# Patient Record
Sex: Male | Born: 1935 | Race: White | Hispanic: No | Marital: Married | Smoking: Former smoker
Health system: Southern US, Community
[De-identification: ages and names within clinical notes are randomized; demographics above are authoritative.]

## PROBLEM LIST (undated history)

## (undated) DIAGNOSIS — E079 Disorder of thyroid, unspecified: Secondary | ICD-10-CM

## (undated) DIAGNOSIS — E119 Type 2 diabetes mellitus without complications: Secondary | ICD-10-CM

## (undated) DIAGNOSIS — C61 Malignant neoplasm of prostate: Secondary | ICD-10-CM

## (undated) HISTORY — PX: CHOLECYSTECTOMY: SHX55

## (undated) HISTORY — DX: Disorder of thyroid, unspecified: E07.9

## (undated) HISTORY — DX: Malignant neoplasm of prostate: C61

## (undated) HISTORY — DX: Type 2 diabetes mellitus without complications: E11.9

## (undated) HISTORY — PX: HERNIA REPAIR: SHX51

---

## 1980-08-01 HISTORY — PX: NOSE SURGERY: SHX723

## 1982-08-01 HISTORY — PX: BACK SURGERY: SHX140

## 1985-08-01 HISTORY — PX: BACK SURGERY: SHX140

## 1995-08-02 HISTORY — PX: PROSTATE SURGERY: SHX751

## 2006-08-01 HISTORY — PX: BACK SURGERY: SHX140

## 2008-12-08 ENCOUNTER — Inpatient Hospital Stay (HOSPITAL_COMMUNITY): Admission: EM | Admit: 2008-12-08 | Discharge: 2008-12-12 | Payer: Self-pay | Admitting: Emergency Medicine

## 2008-12-10 ENCOUNTER — Ambulatory Visit: Payer: Self-pay | Admitting: Physical Medicine & Rehabilitation

## 2008-12-12 ENCOUNTER — Inpatient Hospital Stay (HOSPITAL_COMMUNITY)
Admission: RE | Admit: 2008-12-12 | Discharge: 2008-12-18 | Payer: Self-pay | Admitting: Physical Medicine & Rehabilitation

## 2010-11-09 LAB — GLUCOSE, CAPILLARY
Glucose-Capillary: 111 mg/dL — ABNORMAL HIGH (ref 70–99)
Glucose-Capillary: 116 mg/dL — ABNORMAL HIGH (ref 70–99)
Glucose-Capillary: 117 mg/dL — ABNORMAL HIGH (ref 70–99)
Glucose-Capillary: 118 mg/dL — ABNORMAL HIGH (ref 70–99)
Glucose-Capillary: 121 mg/dL — ABNORMAL HIGH (ref 70–99)
Glucose-Capillary: 123 mg/dL — ABNORMAL HIGH (ref 70–99)
Glucose-Capillary: 129 mg/dL — ABNORMAL HIGH (ref 70–99)
Glucose-Capillary: 115 mg/dL — ABNORMAL HIGH (ref 70–99)
Glucose-Capillary: 122 mg/dL — ABNORMAL HIGH (ref 70–99)
Glucose-Capillary: 128 mg/dL — ABNORMAL HIGH (ref 70–99)
Glucose-Capillary: 130 mg/dL — ABNORMAL HIGH (ref 70–99)
Glucose-Capillary: 138 mg/dL — ABNORMAL HIGH (ref 70–99)
Glucose-Capillary: 139 mg/dL — ABNORMAL HIGH (ref 70–99)
Glucose-Capillary: 140 mg/dL — ABNORMAL HIGH (ref 70–99)
Glucose-Capillary: 147 mg/dL — ABNORMAL HIGH (ref 70–99)
Glucose-Capillary: 199 mg/dL — ABNORMAL HIGH (ref 70–99)

## 2010-11-09 LAB — URINALYSIS, ROUTINE W REFLEX MICROSCOPIC
Bilirubin Urine: NEGATIVE
Ketones, ur: NEGATIVE mg/dL
Ketones, ur: 15 mg/dL — AB
Nitrite: NEGATIVE
Nitrite: NEGATIVE
Protein, ur: NEGATIVE mg/dL
Protein, ur: 100 mg/dL — AB
Specific Gravity, Urine: 1.033 — ABNORMAL HIGH (ref 1.005–1.030)
Urobilinogen, UA: 1 mg/dL (ref 0.0–1.0)
Urobilinogen, UA: 1 mg/dL (ref 0.0–1.0)

## 2010-11-09 LAB — DIFFERENTIAL
Basophils Absolute: 0 10*3/uL (ref 0.0–0.1)
Basophils Relative: 0 % (ref 0–1)
Eosinophils Absolute: 0.2 10*3/uL (ref 0.0–0.7)
Monocytes Absolute: 1.3 10*3/uL — ABNORMAL HIGH (ref 0.1–1.0)
Monocytes Relative: 13 % — ABNORMAL HIGH (ref 3–12)
Neutro Abs: 7.7 10*3/uL (ref 1.7–7.7)
Neutrophils Relative %: 75 % (ref 43–77)

## 2010-11-09 LAB — CBC
HCT: 33.4 % — ABNORMAL LOW (ref 39.0–52.0)
HCT: 22.7 % — ABNORMAL LOW (ref 39.0–52.0)
HCT: 25.5 % — ABNORMAL LOW (ref 39.0–52.0)
HCT: 32.6 % — ABNORMAL LOW (ref 39.0–52.0)
HCT: 34.4 % — ABNORMAL LOW (ref 39.0–52.0)
Hemoglobin: 11.5 g/dL — ABNORMAL LOW (ref 13.0–17.0)
Hemoglobin: 11.4 g/dL — ABNORMAL LOW (ref 13.0–17.0)
Hemoglobin: 12 g/dL — ABNORMAL LOW (ref 13.0–17.0)
Hemoglobin: 8 g/dL — ABNORMAL LOW (ref 13.0–17.0)
Hemoglobin: 8.6 g/dL — ABNORMAL LOW (ref 13.0–17.0)
Hemoglobin: 9 g/dL — ABNORMAL LOW (ref 13.0–17.0)
MCHC: 34.4 g/dL (ref 30.0–36.0)
MCHC: 35.1 g/dL (ref 30.0–36.0)
MCHC: 35.3 g/dL (ref 30.0–36.0)
MCHC: 35.8 g/dL (ref 30.0–36.0)
MCV: 90.7 fL (ref 78.0–100.0)
MCV: 91 fL (ref 78.0–100.0)
MCV: 91.4 fL (ref 78.0–100.0)
MCV: 91.8 fL (ref 78.0–100.0)
Platelets: 123 10*3/uL — ABNORMAL LOW (ref 150–400)
Platelets: 196 10*3/uL (ref 150–400)
Platelets: 85 10*3/uL — ABNORMAL LOW (ref 150–400)
Platelets: 91 10*3/uL — ABNORMAL LOW (ref 150–400)
RBC: 3.63 MIL/uL — ABNORMAL LOW (ref 4.22–5.81)
RBC: 3.8 MIL/uL — ABNORMAL LOW (ref 4.22–5.81)
RBC: 2.47 MIL/uL — ABNORMAL LOW (ref 4.22–5.81)
RBC: 2.65 MIL/uL — ABNORMAL LOW (ref 4.22–5.81)
RBC: 2.97 MIL/uL — ABNORMAL LOW (ref 4.22–5.81)
RDW: 14 % (ref 11.5–15.5)
RDW: 14.1 % (ref 11.5–15.5)
RDW: 12.9 % (ref 11.5–15.5)
RDW: 13 % (ref 11.5–15.5)
RDW: 13.1 % (ref 11.5–15.5)
RDW: 13.2 % (ref 11.5–15.5)
RDW: 13.9 % (ref 11.5–15.5)
WBC: 10.3 10*3/uL (ref 4.0–10.5)
WBC: 8.2 10*3/uL (ref 4.0–10.5)
WBC: 10.9 10*3/uL — ABNORMAL HIGH (ref 4.0–10.5)
WBC: 14.1 10*3/uL — ABNORMAL HIGH (ref 4.0–10.5)
WBC: 16.6 10*3/uL — ABNORMAL HIGH (ref 4.0–10.5)

## 2010-11-09 LAB — URINALYSIS, MICROSCOPIC ONLY
Glucose, UA: NEGATIVE mg/dL
Leukocytes, UA: NEGATIVE
Specific Gravity, Urine: 1.036 — ABNORMAL HIGH (ref 1.005–1.030)
Urobilinogen, UA: 1 mg/dL (ref 0.0–1.0)

## 2010-11-09 LAB — BASIC METABOLIC PANEL
CO2: 26 mEq/L (ref 19–32)
CO2: 28 mEq/L (ref 19–32)
Calcium: 8.3 mg/dL — ABNORMAL LOW (ref 8.4–10.5)
Calcium: 8.4 mg/dL (ref 8.4–10.5)
Chloride: 107 mEq/L (ref 96–112)
Chloride: 107 mEq/L (ref 96–112)
Creatinine, Ser: 0.89 mg/dL (ref 0.4–1.5)
Creatinine, Ser: 1.01 mg/dL (ref 0.4–1.5)
Creatinine, Ser: 1.33 mg/dL (ref 0.4–1.5)
GFR calc Af Amer: >60 mL/min (ref >60)
GFR calc Af Amer: >60 mL/min (ref >60)
GFR calc non Af Amer: 52 mL/min — ABNORMAL LOW (ref >60)
GFR calc non Af Amer: >60 mL/min (ref >60)
GFR calc non Af Amer: >60 mL/min (ref >60)
Glucose, Bld: 133 mg/dL — ABNORMAL HIGH (ref 70–99)
Glucose, Bld: 197 mg/dL — ABNORMAL HIGH (ref 70–99)
Sodium: 138 mEq/L (ref 135–145)

## 2010-11-09 LAB — PROTEIN S ACTIVITY: Protein S Activity: 51 % — ABNORMAL LOW (ref 69–129)

## 2010-11-09 LAB — COMPREHENSIVE METABOLIC PANEL
ALT: 59 U/L — ABNORMAL HIGH (ref 0–53)
Alkaline Phosphatase: 106 U/L (ref 39–117)
BUN: 17 mg/dL (ref 6–23)
Chloride: 100 mEq/L (ref 96–112)
Glucose, Bld: 129 mg/dL — ABNORMAL HIGH (ref 70–99)
Potassium: 4.3 mEq/L (ref 3.5–5.1)
Sodium: 136 mEq/L (ref 135–145)
Total Bilirubin: 2.2 mg/dL — ABNORMAL HIGH (ref 0.3–1.2)
Total Protein: 6.2 g/dL (ref 6.0–8.3)

## 2010-11-09 LAB — HEPATIC FUNCTION PANEL
Albumin: 2.9 g/dL — ABNORMAL LOW (ref 3.5–5.2)
Indirect Bilirubin: 1.6 mg/dL — ABNORMAL HIGH (ref 0.3–0.9)
Indirect Bilirubin: 1.6 mg/dL — ABNORMAL HIGH (ref 0.3–0.9)
Total Bilirubin: 2 mg/dL — ABNORMAL HIGH (ref 0.3–1.2)
Total Protein: 6.1 g/dL (ref 6.0–8.3)
Total Protein: 6.8 g/dL (ref 6.0–8.3)

## 2010-11-09 LAB — URINE CULTURE
Colony Count: 10000
Colony Count: NO GROWTH
Culture: NO GROWTH

## 2010-11-09 LAB — POCT I-STAT, CHEM 8
Calcium, Ion: 1.14 mmol/L (ref 1.12–1.32)
Glucose, Bld: 143 mg/dL — ABNORMAL HIGH (ref 70–99)
HCT: 48 % (ref 39.0–52.0)
Hemoglobin: 16.3 g/dL (ref 13.0–17.0)
Potassium: 3.1 mEq/L — ABNORMAL LOW (ref 3.5–5.1)
TCO2: 26 mmol/L (ref 0–100)

## 2010-11-09 LAB — BETA-2-GLYCOPROTEIN I ABS, IGG/M/A: Beta-2-Glycoprotein I IgA: 24 U/mL (ref <10)

## 2010-11-09 LAB — CARDIOLIPIN ANTIBODIES, IGG, IGM, IGA: Anticardiolipin IgG: <7 [GPL'U] — ABNORMAL LOW (ref <11)

## 2010-11-09 LAB — ABO/RH: ABO/RH(D): A POS

## 2010-11-09 LAB — FACTOR 5 LEIDEN

## 2010-11-09 LAB — CROSSMATCH: Antibody Screen: NEGATIVE

## 2010-11-09 LAB — PROTIME-INR
INR: 1 (ref 0.00–1.49)
Prothrombin Time: 13.9 seconds (ref 11.6–15.2)
Prothrombin Time: 14.6 seconds (ref 11.6–15.2)

## 2010-11-09 LAB — LUPUS ANTICOAGULANT PANEL: PTT Lupus Anticoagulant: 37.6 secs (ref 36.3–48.8)

## 2010-11-09 LAB — PROTEIN S, TOTAL: Protein S Ag, Total: 74 % (ref 70–140)

## 2010-11-09 LAB — PROTEIN C ACTIVITY: Protein C Activity: 131 % (ref 75–133)

## 2010-11-09 LAB — HOMOCYSTEINE: Homocysteine: 10.4 umol/L (ref 4.0–15.4)

## 2010-11-09 LAB — URINE MICROSCOPIC-ADD ON

## 2010-11-10 ENCOUNTER — Ambulatory Visit: Payer: Self-pay | Admitting: Internal Medicine
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
## 2012-02-01 ENCOUNTER — Ambulatory Visit: Payer: Self-pay | Admitting: Family Medicine

## 2014-10-17 ENCOUNTER — Ambulatory Visit: Payer: Self-pay | Admitting: Gastroenterology

## 2014-11-24 LAB — SURGICAL PATHOLOGY

## 2018-04-18 ENCOUNTER — Encounter: Payer: Self-pay | Admitting: Hematology and Oncology

## 2018-04-18 ENCOUNTER — Inpatient Hospital Stay: Payer: Medicare Other

## 2018-04-18 ENCOUNTER — Inpatient Hospital Stay: Payer: Medicare Other | Attending: Hematology and Oncology | Admitting: Hematology and Oncology

## 2018-04-18 VITALS — BP 136/69 | HR 57 | Temp 97.4°F | Resp 18 | Ht 66.0 in | Wt 152.8 lb

## 2018-04-18 DIAGNOSIS — Z87891 Personal history of nicotine dependence: Secondary | ICD-10-CM | POA: Insufficient documentation

## 2018-04-18 DIAGNOSIS — Z8546 Personal history of malignant neoplasm of prostate: Secondary | ICD-10-CM | POA: Insufficient documentation

## 2018-04-18 DIAGNOSIS — E119 Type 2 diabetes mellitus without complications: Secondary | ICD-10-CM

## 2018-04-18 DIAGNOSIS — Z79899 Other long term (current) drug therapy: Secondary | ICD-10-CM | POA: Diagnosis not present

## 2018-04-18 DIAGNOSIS — R161 Splenomegaly, not elsewhere classified: Secondary | ICD-10-CM | POA: Diagnosis not present

## 2018-04-18 DIAGNOSIS — M1711 Unilateral primary osteoarthritis, right knee: Secondary | ICD-10-CM | POA: Diagnosis not present

## 2018-04-18 DIAGNOSIS — G8929 Other chronic pain: Secondary | ICD-10-CM | POA: Diagnosis not present

## 2018-04-18 DIAGNOSIS — Z7982 Long term (current) use of aspirin: Secondary | ICD-10-CM | POA: Diagnosis not present

## 2018-04-18 DIAGNOSIS — E079 Disorder of thyroid, unspecified: Secondary | ICD-10-CM | POA: Diagnosis not present

## 2018-04-18 DIAGNOSIS — R5383 Other fatigue: Secondary | ICD-10-CM | POA: Diagnosis not present

## 2018-04-18 DIAGNOSIS — D72821 Monocytosis (symptomatic): Secondary | ICD-10-CM | POA: Diagnosis not present

## 2018-04-18 DIAGNOSIS — Z7984 Long term (current) use of oral hypoglycemic drugs: Secondary | ICD-10-CM | POA: Insufficient documentation

## 2018-04-18 DIAGNOSIS — D696 Thrombocytopenia, unspecified: Secondary | ICD-10-CM | POA: Diagnosis present

## 2018-04-18 DIAGNOSIS — Z882 Allergy status to sulfonamides status: Secondary | ICD-10-CM | POA: Diagnosis not present

## 2018-04-18 LAB — CBC WITH DIFFERENTIAL/PLATELET
BASOS PCT: 1 %
Basophils Absolute: 0.1 10*3/uL (ref 0–0.1)
EOS ABS: 0.3 10*3/uL (ref 0–0.7)
Eosinophils Relative: 3 %
HCT: 42.8 % (ref 40.0–52.0)
HEMOGLOBIN: 14.5 g/dL (ref 13.0–18.0)
LYMPHS ABS: 2.7 10*3/uL (ref 1.0–3.6)
Lymphocytes Relative: 31 %
MCH: 30.9 pg (ref 26.0–34.0)
MCHC: 33.8 g/dL (ref 32.0–36.0)
MCV: 91.5 fL (ref 80.0–100.0)
Monocytes Absolute: 1.5 10*3/uL — ABNORMAL HIGH (ref 0.2–1.0)
Monocytes Relative: 17 %
NEUTROS PCT: 48 %
Neutro Abs: 4.2 10*3/uL (ref 1.4–6.5)
Platelets: 143 10*3/uL — ABNORMAL LOW (ref 150–440)
RBC: 4.68 MIL/uL (ref 4.40–5.90)
RDW: 14.2 % (ref 11.5–14.5)
WBC: 8.8 10*3/uL (ref 3.8–10.6)

## 2018-04-18 LAB — FOLATE: Folate: 70.3 ng/mL (ref >5.9)

## 2018-04-18 LAB — TECHNOLOGIST SMEAR REVIEW

## 2018-04-18 LAB — PROTIME-INR
INR: 0.98
PROTHROMBIN TIME: 12.8 s (ref 11.4–15.2)

## 2018-04-18 LAB — PLATELET BY CITRATE: PLATELET CT IN CITRATE: 140

## 2018-04-18 LAB — APTT: APTT: 32 s (ref 24–36)

## 2018-04-18 LAB — VITAMIN B12: Vitamin B-12: 558 pg/mL (ref 180–914)

## 2018-04-18 NOTE — Progress Notes (Signed)
[facility] day:  04/18/2018  Chief Complaint: Zachary Wells is a 84 y.o. male with thrombocytopenia who is referred in consultation by Dr Hortencia Pilar for assessment and management.  HPI: Patient diagnosed with thrombocytopenia in 03/2018.  Repeat labs in 2 weeks revealed an even lower platelets count. On 3rd lab draw, platelets continued to decline and he was therefore referred for hematology evaluation and management.   Review of labs dating back to 09/08/2014 reveals intermittent thrombocytopenia.  Platelet count has ranged between 102,000 - 245,000.  He has had persistent mild thrombocytopenia since 03/2018.  Platelet count was 146,000 on 03/13/2018, 139,000 on 03/27/2018, and 129,000 on 04/10/2018.  CBC on 04/10/2018 revealed a hematocrit of 40.8, hemoglobin 13.9, MCV 89, platelets 129,000, WBC 7800 with an ANC of 4100.  Differential included 52% neutrophils, 30% lymphs, 13% monocytes, and 5% eosinophils.  Absolute monocyte count was 1000.  Absolute monocyte count has ranged between 1000 - 1800 since 09/2014 except twice (800-900) between 03/13/2018 - 03/27/2018.  Monocytes have ranged from 10-27%.  Additional labs on 03/13/2018 included a creatinine of 1.5, normal LFTs, TSH 2.57.  Patient denies new medications. Patient takes glucosamine, TMV, coconut oil capsules, and fish oil daily.   Symptomatically, patient is fatigued. He spends the majority of his day sleeping. He maintains the ability to independently perform all of his ADLs. Patient "strangles" easily. Patient denies that he has experienced any B symptoms. He denies any recent infections. Patient has received blood transfusions in the past.   Patient complains of chronic pain in his RIGHT knee related to osteoarthritis. PMH significant for recurrent skin cancers.  Patient has a history of prostate cancer. Additionally, he had 2 brothers with prostate cancer.   Past Medical History:   Diagnosis Date  . Diabetes mellitus without complication ([place])   . Prostate cancer ([place])   . Thyroid disease     Past Surgical History:  Procedure Laterality Date  . BACK SURGERY  2008  . BACK SURGERY  1987  . BACK SURGERY  1984  . CHOLECYSTECTOMY    . HERNIA REPAIR  2004 and 2002  . NOSE SURGERY  1982  . PROSTATE SURGERY  1997    Family History  Problem Relation Age of Onset  . Cancer Brother     Social History:  reports that he has quit smoking. He has a 20.00 pack-year smoking history. He does not have any smokeless tobacco history on file. He reports that he drank alcohol. He reports that he does not use drugs.  Former smoker 40 years ago. No alcohol intake. Past exposures of PCB (transformer oil). The patient is accompanied by his wife and son today.  Allergies:  Allergies  Allergen Reactions  . Sulfa Antibiotics Rash  . Simvastatin Other (See Comments)    Current Medications: Current Outpatient Medications  Medication Sig Dispense Refill  . aspirin (ASPIR-LOW) 81 MG EC tablet Take 81 mg by mouth daily.    . Blood Glucose Monitoring Suppl (FIFTY50 GLUCOSE METER 2.0) w/Device KIT Please check sugars once daily E11.9 Freestyle    . cetirizine (ZYRTEC) 10 MG tablet Take 10 mg by mouth daily.    . clopidogrel (PLAVIX) 75 MG tablet Take 75 mg by mouth daily.    Marland Kitchen desoximetasone (TOPICORT) 0.25 % cream desoximetasone 0.25 % topical cream    . erythromycin ophthalmic ointment Place 1 application into both eyes daily.    . fluocinonide cream (LIDEX) 0.05 % fluocinonide  0.05 % topical cream    . fluorouracil (EFUDEX) 5 % cream fluorouracil 5 % topical cream    . fluticasone (FLONASE) 50 MCG/ACT nasal spray Place 1 spray into both nostrils daily.    . Glucosamine Sulfate 1000 MG CAPS Take 2,000 mg by mouth daily.    Marland Kitchen glucose blood test strip FreeStyle Lite Strips    . Lancets 28G MISC Lancets,Ultra Thin 26 gauge    . levothyroxine (SYNTHROID, LEVOTHROID) 75 MCG tablet Take  75 mcg by mouth daily.    . metFORMIN (GLUCOPHAGE) 500 MG tablet Take 500 mg by mouth daily.    . Multiple Vitamins-Minerals (CENTRUM SILVER PO) Take 1 tablet by mouth daily.    . Omega-3 Fatty Acids (OMEGA-3 FISH OIL PO) Take 400 mg by mouth daily.    . rosuvastatin (CRESTOR) 40 MG tablet Take 40 mg by mouth daily.    . sildenafil (VIAGRA) 50 MG tablet Take 50 mg by mouth as needed.    . simvastatin (ZOCOR) 20 MG tablet Take 20 mg by mouth daily.     No current facility-administered medications for this visit.     Review of Systems:  GENERAL:  Feels "fine", but tired.  Sleeps a lot.  No fevers, sweats or weight loss. PERFORMANCE STATUS (ECOG): 1 HEENT:  HOH; bilateral hearing aides. No visual changes, runny nose, sore throat, mouth sores or tenderness. Lungs: No shortness of breath or cough.  No hemoptysis. Cardiac:  No chest pain, palpitations, orthopnea, or PND. GI:  "Strangles easily".  No nausea, vomiting, diarrhea, constipation, melena or hematochezia. GU:  No urgency, frequency, dysuria, or hematuria. Musculoskeletal:  No back pain.  RIGHT knee pain; OA.  No muscle tenderness. Extremities:  No pain or swelling. Skin:  No rashes or skin changes. Neuro:  No headache, numbness or weakness, balance or coordination issues. Endocrine:  Diabetes.  No thyroid issues, hot flashes or night sweats. Psych:  No mood changes, depression or anxiety. Pain:  No focal pain. Review of systems:  All other systems reviewed and found to be negative.  Physical Exam: Blood pressure 136/69, pulse (!) 57, temperature (!) 97.4 F (36.3 C), temperature source Tympanic, resp. rate 18, height 5' 6"  (1.676 m), weight 69 lb 6 oz (31.5 kg). GENERAL:  Well developed, well nourished, gentleman sitting comfortably in the exam room in no acute distress. MENTAL STATUS:  Alert and oriented to person, place and time. HEAD:  White hair.  Normocephalic, atraumatic, face symmetric, no Cushingoid features. EYES:  Blue  eyes.  Pupils equal round and reactive to light and accomodation.  No conjunctivitis or scleral icterus. ENT:  Hearing aide.  Oropharynx clear without lesion.  Tongue normal. Mucous membranes moist.  RESPIRATORY:  Clear to auscultation without rales, wheezes or rhonchi. CARDIOVASCULAR:  Regular rate and rhythm without murmur, rub or gallop. ABDOMEN:  Soft, non-tender, with active bowel sounds, and no hepatomegaly.  Spleen tip palpable.  No masses. SKIN:  No rashes, ulcers or lesions. EXTREMITIES: No edema, no skin discoloration or tenderness.  No palpable cords. LYMPH NODES: No palpable cervical, supraclavicular, axillary or inguinal adenopathy  NEUROLOGICAL: Unremarkable. PSYCH:  Appropriate.   Office Visit on 04/18/2018  Component Date Value Ref Range Status  . Tech Review 04/18/2018 MORPHOLOGY UNREMARKABLE   Final   Performed at [facility], [address] [place], [place] [address]  . aPTT 04/18/2018 32  24 - 36 seconds Final   Performed at [facility], [place],  [place] [address]  . Prothrombin Time 04/18/2018 12.8  11.4 - 15.2 seconds Final  . INR 04/18/2018 0.98   Final   Performed at [facility], [address] [place], [place] [address]  . HIV Screen 4th Generation wRfx 04/18/2018 Non Reactive  Non Reactive Final   Comment: (NOTE) Performed At: [facility] [place], [place] [id number] Rush Farmer MD [id number]   . Hep B Core Total Ab 04/18/2018 Negative  Negative Final   Comment: (NOTE) Performed At: [facility] [place], [place] [id number] Rush Farmer MD [id number]   . HCV Ab 04/18/2018 <0.1  0.0 - 0.9 s/co ratio Final   Comment: (NOTE)                                  Negative:     < 0.8                             Indeterminate: 0.8 - 0.9                                  Positive:     > 0.9 The CDC recommends that a positive HCV antibody result be  followed up with a HCV Nucleic Acid Amplification test (481856). Performed At: [facility] [place], [place] [id number] Rush Farmer MD [id number]   . Anti Nuclear Antibody(ANA) 04/18/2018 Negative  Negative Final   Comment: (NOTE) Performed At: [facility] [place], [place] [id number] Rush Farmer MD [id number]   . Copper 04/18/2018 90  72 - 166 ug/dL Final   Comment: (NOTE) This test was developed and its performance characteristics determined by [facility]. It has not been cleared or approved by the Food and Drug Administration.                                Detection Limit = 5 Performed At: [facility] [place], [place] [id number] Rush Farmer MD [id number]   . Folate 04/18/2018 70.3  >5.9 ng/mL Final   Comment: RESULT CONFIRMED BY MANUAL DILUTION AKT Performed at [facility], [address] [place], [place] [address]   . Vitamin B-12 04/18/2018 558  180 - 914 pg/mL Final   Comment: (NOTE) This assay is not validated for testing neonatal or myeloproliferative syndrome specimens for Vitamin B12 levels. Performed at [facility], [place] [address] [place], [place] [address]   . Platelet CT in Citrtae 04/18/2018 140   Final   Comment: PLATELET COUNT PERFORMED ON CITRATED BLOOD Performed at [facility], [address], [place], [place] [address]   . WBC 04/18/2018 8.8  3.8 - 10.6 K/uL Final  . RBC 04/18/2018 4.68  4.40 - 5.90 MIL/uL Final  . Hemoglobin 04/18/2018 14.5  13.0 - 18.0 g/dL Final  . HCT 04/18/2018 42.8  40.0 - 52.0 % Final  . MCV 04/18/2018 91.5  80.0 - 100.0 fL Final  . MCH 04/18/2018 30.9  26.0 - 34.0 pg Final  . MCHC 04/18/2018 33.8  32.0 - 36.0 g/dL Final  . RDW 04/18/2018 14.2  11.5 - 14.5 % Final  . Platelets 04/18/2018 143* 150 - 440 K/uL Final  .  Neutrophils Relative % 04/18/2018 48  % Final  . Neutro Abs 04/18/2018 4.2  1.4 - 6.5 K/uL  Final  . Lymphocytes Relative 04/18/2018 31  % Final  . Lymphs Abs 04/18/2018 2.7  1.0 - 3.6 K/uL Final  . Monocytes Relative 04/18/2018 17  % Final  . Monocytes Absolute 04/18/2018 1.5* 0.2 - 1.0 K/uL Final  . Eosinophils Relative 04/18/2018 3  % Final  . Eosinophils Absolute 04/18/2018 0.3  0 - 0.7 K/uL Final  . Basophils Relative 04/18/2018 1  % Final  . Basophils Absolute 04/18/2018 0.1  0 - 0.1 K/uL Final   Performed at [facility], [address], [place], [place] [address]    Assessment:  JARRIEL PAPILLION is a 84 y.o. male with mild thrombocytopenia.  He has had intermittent thrombocytopenia dating back to 09/2014.  Platelet count has ranged between 102,000 - 245,000.  He has had persistent mild thrombocytopenia since 03/2018.  Platelet count was 146,000 on 03/13/2018 and 129,000 on 04/10/2018.  He has had persistent monocytosis since 2016.  He denies any new medications or herbal products.  He has received blood transfusions in the past.  He has no history of autoimmune disease or hepatitis.  Symptomatically, he is fatigued.  He denies any B symptoms.  Exam reveals palpable spleen tip.  Plan: 1.  Labs today:  CBC with diff, platelet count in blue top tube, B12, folate, copper, ANA with reflex, hepatitis B core antibody, hepatitis C antibody, HIV testing, PT, PTT, BCR-ABL. 2.  Peripheral smear for path review. 3.  Discuss differential diagnosis:  pseudothrombocytopenia, nutritional deficiencies, medications and herbal products, viruses, liver disease, myelodysplastic syndromes including CMML.  Patient noted to have persistent monocytosis since 2016. 3.  Schedule ultrasound. 4.  RTC in 1 weeks (after [place]) for MD assessment and to review workup   Honor Loh, NP  04/18/2018, 11:57 AM   I saw and evaluated the patient, participating in the key portions of the service and reviewing pertinent diagnostic studies and records.  I reviewed the nurse practitioner's note and agree  with the findings and the plan.  The assessment and plan were discussed with the patient.  Multiple questions were asked by the patient and answered.   Nolon Stalls, MD 04/18/2018, 11:57 AM

## 2018-04-18 NOTE — Progress Notes (Signed)
Patient here today as new evaluation regarding thrombocytopenia.  Referred by Dr. Hoy Morn.

## 2018-04-19 LAB — HEPATITIS B CORE ANTIBODY, TOTAL: Hep B Core Total Ab: NEGATIVE

## 2018-04-19 LAB — HIV ANTIBODY (ROUTINE TESTING W REFLEX): HIV Screen 4th Generation wRfx: NONREACTIVE

## 2018-04-19 LAB — ANA W/REFLEX: Anti Nuclear Antibody(ANA): NEGATIVE

## 2018-04-19 LAB — HEPATITIS C ANTIBODY

## 2018-04-20 LAB — COPPER, SERUM: Copper: 90 ug/dL (ref 72–166)

## 2018-04-22 ENCOUNTER — Encounter: Payer: Self-pay | Admitting: Hematology and Oncology

## 2018-04-24 ENCOUNTER — Ambulatory Visit
Admission: RE | Admit: 2018-04-24 | Discharge: 2018-04-24 | Disposition: A | Discharge status: Home / Self Care | Payer: Medicare Other | Source: Ambulatory Visit | Attending: Urgent Care | Admitting: Urgent Care

## 2018-04-24 DIAGNOSIS — R161 Splenomegaly, not elsewhere classified: Secondary | ICD-10-CM | POA: Insufficient documentation

## 2018-04-24 DIAGNOSIS — D696 Thrombocytopenia, unspecified: Secondary | ICD-10-CM

## 2018-04-25 ENCOUNTER — Encounter: Payer: Self-pay | Admitting: Hematology and Oncology

## 2018-04-25 ENCOUNTER — Inpatient Hospital Stay (HOSPITAL_BASED_OUTPATIENT_CLINIC_OR_DEPARTMENT_OTHER): Payer: Medicare Other | Admitting: Hematology and Oncology

## 2018-04-25 VITALS — BP 141/62 | HR 59 | Temp 94.8°F | Wt 155.0 lb

## 2018-04-25 DIAGNOSIS — E119 Type 2 diabetes mellitus without complications: Secondary | ICD-10-CM

## 2018-04-25 DIAGNOSIS — D696 Thrombocytopenia, unspecified: Secondary | ICD-10-CM | POA: Diagnosis not present

## 2018-04-25 DIAGNOSIS — Z79899 Other long term (current) drug therapy: Secondary | ICD-10-CM

## 2018-04-25 DIAGNOSIS — D72821 Monocytosis (symptomatic): Secondary | ICD-10-CM

## 2018-04-25 DIAGNOSIS — Z87891 Personal history of nicotine dependence: Secondary | ICD-10-CM

## 2018-04-25 DIAGNOSIS — G8929 Other chronic pain: Secondary | ICD-10-CM

## 2018-04-25 DIAGNOSIS — Z8546 Personal history of malignant neoplasm of prostate: Secondary | ICD-10-CM

## 2018-04-25 DIAGNOSIS — Z882 Allergy status to sulfonamides status: Secondary | ICD-10-CM

## 2018-04-25 DIAGNOSIS — Z7984 Long term (current) use of oral hypoglycemic drugs: Secondary | ICD-10-CM

## 2018-04-25 DIAGNOSIS — Z7982 Long term (current) use of aspirin: Secondary | ICD-10-CM

## 2018-04-25 DIAGNOSIS — R161 Splenomegaly, not elsewhere classified: Secondary | ICD-10-CM | POA: Diagnosis not present

## 2018-04-25 DIAGNOSIS — R5383 Other fatigue: Secondary | ICD-10-CM

## 2018-04-25 DIAGNOSIS — E079 Disorder of thyroid, unspecified: Secondary | ICD-10-CM

## 2018-04-25 DIAGNOSIS — M1711 Unilateral primary osteoarthritis, right knee: Secondary | ICD-10-CM

## 2018-04-25 NOTE — Progress Notes (Signed)
[facility] day: 04/25/2018   Chief Complaint: Zachary Wells is a 84 y.o. male with thrombocytopenia who is seen for review of work-up and discussion regarding direction of therapy.  HPI:  The patient was last seen in the hematology clinic on 04/18/2018 for initial consultation. He had persistent mild thrombocytopenia since 03/2018.  He had persistent monocytosis since 2016.  Symptomatically, he felt fatigued.  Workup included the following normal studies:  PT, PTT, hepatitis B core antibody, hepatitis C antibody, HIV antibody, ANA with reflex, B12, folate, and copper level.   Platelet count in a blue top tube (citrated) was 140,000.  Peripheral smear was unremarkable.  BCR-ABL is pending.  Abdominal ultrasound on 04/24/2018 revealed no splenomegaly.  Liver and spleen were unremarkable.  During the interim, patient has been doing well overall. He notes that he remains fatigued, however he has felt well over the last few days. Patient denies acute complaints. Patient denies that he has experienced any B symptoms. He denies any interval infections.   Patient advises that he maintains an adequate appetite. He is eating well.Weight today is 154 lb 15.7 oz (70.3 kg), which compared to his last visit to the clinic, represents a 2 pound.     Patient denies pain in the clinic today.   Past Medical History:  Diagnosis Date  . Diabetes mellitus without complication ([place])   . Prostate cancer ([place])   . Thyroid disease     Past Surgical History:  Procedure Laterality Date  . BACK SURGERY  2008  . BACK SURGERY  1987  . BACK SURGERY  1984  . CHOLECYSTECTOMY    . HERNIA REPAIR  2004 and 2002  . NOSE SURGERY  1982  . PROSTATE SURGERY  1997    Family History  Problem Relation Age of Onset  . Cancer Brother     Social History:  reports that he has quit smoking. He has a 20.00 pack-year smoking history. He does not have any smokeless tobacco  history on file. He reports that he drank alcohol. He reports that he does not use drugs.  Former smoker 40 years ago. No alcohol intake. Past exposures of PCB (transformer oil). The patient is accompanied by his wife today.  Allergies:  Allergies  Allergen Reactions  . Sulfa Antibiotics Rash  . Simvastatin Other (See Comments)    Current Medications: Current Outpatient Medications  Medication Sig Dispense Refill  . aspirin (ASPIR-LOW) 81 MG EC tablet Take 81 mg by mouth daily.    . Blood Glucose Monitoring Suppl (FIFTY50 GLUCOSE METER 2.0) w/Device KIT Please check sugars once daily E11.9 Freestyle    . cetirizine (ZYRTEC) 10 MG tablet Take 10 mg by mouth daily.    . clopidogrel (PLAVIX) 75 MG tablet Take 75 mg by mouth daily.    Marland Kitchen desoximetasone (TOPICORT) 0.25 % cream desoximetasone 0.25 % topical cream    . erythromycin ophthalmic ointment Place 1 application into both eyes daily.    . fluocinonide cream (LIDEX) 0.05 % fluocinonide 0.05 % topical cream    . fluorouracil (EFUDEX) 5 % cream fluorouracil 5 % topical cream    . fluticasone (FLONASE) 50 MCG/ACT nasal spray Place 1 spray into both nostrils daily.    . Glucosamine Sulfate 1000 MG CAPS Take 2,000 mg by mouth daily.    Marland Kitchen glucose blood test strip FreeStyle Lite Strips    . Lancets 28G MISC Lancets,Ultra Thin 26 gauge    . levothyroxine (  SYNTHROID, LEVOTHROID) 75 MCG tablet Take 75 mcg by mouth daily.    . metFORMIN (GLUCOPHAGE) 500 MG tablet Take 500 mg by mouth daily.    . Multiple Vitamins-Minerals (CENTRUM SILVER PO) Take 1 tablet by mouth daily.    . Omega-3 Fatty Acids (OMEGA-3 FISH OIL PO) Take 400 mg by mouth daily.    . rosuvastatin (CRESTOR) 40 MG tablet Take 40 mg by mouth daily.    . sildenafil (VIAGRA) 50 MG tablet Take 50 mg by mouth as needed.    . simvastatin (ZOCOR) 20 MG tablet Take 20 mg by mouth daily.     No current facility-administered medications for this visit.     Review of Systems   Constitutional: Positive for malaise/fatigue. Negative for diaphoresis, fever and weight loss (up 2 pounds; "I am eating ice cream").  HENT: Positive for hearing loss (hearing aides).   Eyes: Negative.   Respiratory: Negative for cough, hemoptysis, sputum production and shortness of breath.   Cardiovascular: Negative for chest pain, palpitations, orthopnea, leg swelling and PND.  Gastrointestinal: Negative for abdominal pain, blood in stool, constipation, diarrhea, melena, nausea and vomiting.       Strangles easily  Genitourinary: Negative for dysuria, frequency, hematuria and urgency.  Musculoskeletal: Positive for joint pain (RIGHT knee pain; OA). Negative for back pain, falls and myalgias.  Skin: Negative for itching and rash.  Neurological: Negative for dizziness, tremors, weakness and headaches.  Endo/Heme/Allergies: Does not bruise/bleed easily.       Diabetes  Psychiatric/Behavioral: Negative for depression, memory loss and suicidal ideas. The patient is not nervous/anxious and does not have insomnia.   All other systems reviewed and are negative.  Performance status (ECOG): 1 - Symptomatic but completely ambulatory  Vital Signs: BP (!) 141/62 (BP Location: Right Arm, Patient Position: Sitting)   Pulse (!) 59   Temp (!) 94.8 F (34.9 C) (Tympanic)   Wt 154 lb 15.7 oz (70.3 kg)   BMI 25.01 kg/m   Physical Exam  Constitutional: He is oriented to person, place, and time and well-developed, well-nourished, and in no distress.  HENT:  Head: Normocephalic and atraumatic.  Mouth/Throat: Mucous membranes are normal.  White hair  Eyes: Conjunctivae and EOM are normal. No scleral icterus.  Blue eyes  Neurological: He is alert and oriented to person, place, and time.  Psychiatric: Mood, affect and judgment normal.  Nursing note and vitals reviewed.    No visits with results within 3 Day(s) from this visit.  Latest known visit with results is:  Office Visit on 04/18/2018   Component Date Value Ref Range Status  . Tech Review 04/18/2018 MORPHOLOGY UNREMARKABLE   Final   Performed at [facility], [address] [place], [place] [address]  . aPTT 04/18/2018 32  24 - 36 seconds Final   Performed at [facility], [address] [place], [place] [address]  . Prothrombin Time 04/18/2018 12.8  11.4 - 15.2 seconds Final  . INR 04/18/2018 0.98   Final   Performed at [facility], [address], [place], [place] [address]  . HIV Screen 4th Generation wRfx 04/18/2018 Non Reactive  Non Reactive Final   Comment: (NOTE) Performed At: [facility] [place], [place] [id number] Rush Farmer MD [id number]   . Hep B Core Total Ab 04/18/2018 Negative  Negative Final   Comment: (NOTE) Performed At: [facility] [place], [place] [id number] Rush Farmer MD [id number]   .  HCV Ab 04/18/2018 <0.1  0.0 - 0.9 s/co ratio Final   Comment: (NOTE)                                  Negative:     < 0.8                             Indeterminate: 0.8 - 0.9                                  Positive:     > 0.9 The CDC recommends that a positive HCV antibody result be followed up with a HCV Nucleic Acid Amplification test (568127). Performed At: [facility] [place], [place] [id number] Rush Farmer MD [id number]   . Anti Nuclear Antibody(ANA) 04/18/2018 Negative  Negative Final   Comment: (NOTE) Performed At: [facility] [place], [place] [id number] Rush Farmer MD [id number]   . Copper 04/18/2018 90  72 - 166 ug/dL Final   Comment: (NOTE) This test was developed and its performance characteristics determined by [facility]. It has not been cleared or approved by the Food and Drug Administration.                                Detection Limit = 5 Performed At: [facility] [place], [place] [id number] Rush Farmer MD [id number]   . Folate 04/18/2018 70.3  >5.9 ng/mL Final   Comment: RESULT CONFIRMED BY MANUAL DILUTION AKT Performed at [facility], [address] [place], [place] [address]   . Vitamin B-12 04/18/2018 558  180 - 914 pg/mL Final   Comment: (NOTE) This assay is not validated for testing neonatal or myeloproliferative syndrome specimens for Vitamin B12 levels. Performed at [facility], [place] [address] [place], [place] [address]   . Platelet CT in Citrtae 04/18/2018 140   Final   Comment: PLATELET COUNT PERFORMED ON CITRATED BLOOD Performed at [facility], [address] [place], [place] [address]   . WBC 04/18/2018 8.8  3.8 - 10.6 K/uL Final  . RBC 04/18/2018 4.68  4.40 - 5.90 MIL/uL Final  . Hemoglobin 04/18/2018 14.5  13.0 - 18.0 g/dL Final  . HCT 04/18/2018 42.8  40.0 - 52.0 % Final  . MCV 04/18/2018 91.5  80.0 - 100.0 fL Final  . MCH 04/18/2018 30.9  26.0 - 34.0 pg Final  . MCHC 04/18/2018 33.8  32.0 - 36.0 g/dL Final  . RDW 04/18/2018 14.2  11.5 - 14.5 % Final  . Platelets 04/18/2018 143* 150 - 440 K/uL Final  . Neutrophils Relative % 04/18/2018 48  % Final  . Neutro Abs 04/18/2018 4.2  1.4 - 6.5 K/uL Final  . Lymphocytes Relative 04/18/2018 31  % Final  . Lymphs Abs 04/18/2018 2.7  1.0 - 3.6 K/uL Final  . Monocytes Relative 04/18/2018 17  % Final  . Monocytes Absolute 04/18/2018 1.5* 0.2 - 1.0 K/uL Final  . Eosinophils Relative 04/18/2018 3  % Final  . Eosinophils Absolute 04/18/2018 0.3  0 - 0.7 K/uL Final  . Basophils Relative 04/18/2018 1  % Final  . Basophils Absolute 04/18/2018 0.1  0 - 0.1 K/uL Final  Performed at [facility], [address] [place], [place] [address]    Assessment:  Zachary Wells is a 84 y.o. male with mild thrombocytopenia.  He has had intermittent thrombocytopenia dating back to 09/2014.  Platelet count has ranged between 102,000 - 245,000.  He has had persistent mild  thrombocytopenia since 03/2018.  Platelet count was 146,000 on 03/13/2018 and 129,000 on 04/10/2018.  He has had persistent monocytosis since 2016.  Work-up on 04/18/2018 revealed the following normal studies:  PT, PTT, hepatitis B core antibody, hepatitis C antibody, HIV antibody, ANA with reflex, B12, folate, and copper level.   Platelet count in a blue top tube (citrated) was 140,000.  Peripheral smear was unremarkable.  BCR-ABL is pending.  Abdominal ultrasound on 04/24/2018 revealed no splenomegaly.  Liver and spleen were unremarkable.  He denies any new medications or herbal products.  He has received blood transfusions in the past.  He has no history of autoimmune disease or hepatitis.  Symptomatically, he is fatigued.  He denies any B symptoms.  Exam is unremarkable.  Plan: 1. Thrombocytopenia:  Review work-up.  No evidence of pseudo-thrombocytopenia, nutritional deficiencies, hepatitis B/C, HIV disease. No medications implicated.  Liver and spleen unremarkable.  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 No indication for further testing (bone marrow) at this time.    Discuss indications for treatment if CMML is diagnosed (B symptoms, symptomatic splenomegaly, worsening counts).  Discuss plan for observation.  2.  RTC in 6 months for MD assessment ans labs (CBC with diff).   Honor Loh, NP  04/25/2018, 3:46 PM   I saw and evaluated the patient, participating in the key portions of the service and reviewing pertinent diagnostic studies and records.  I reviewed the nurse practitioner's note and agree with the findings and the plan.  The assessment and plan were discussed with the patient.  Multiple questions were asked by the patient and answered.   Nolon Stalls, MD 04/25/2018, 3:46 PM

## 2018-04-25 NOTE — Progress Notes (Signed)
Patient here today for US results. 

## 2018-04-25 NOTE — Progress Notes (Deleted)
Weight on 04-18-18 was 69.38 kg

## 2018-04-28 ENCOUNTER — Encounter: Payer: Self-pay | Admitting: Hematology and Oncology

## 2018-04-28 LAB — BCR-ABL1 FISH
CELLS ANALYZED: 200
CELLS COUNTED: 200

## 2018-09-16 NOTE — Progress Notes (Signed)
[facility] day: 09/17/2018   Chief Complaint: Zachary Wells is a 85 y.o. male with thrombocytopenia and persistent monocytosis who is seen for 5 month assessment.  HPI:  The patient was last seen in the hematology clinic on 04/25/2018. At that time, work-up was negative.  We discussed possible CMML as he had monocytosis since 2016.  We discussed implications for treatment.  We discussed close surveillance.  CBC on 09/13/2018 revealed a hematocrit of 39.7, hemoglobin 13.2, MCV 91, platelets 117,000, and WBC 9200.  BUN was 30 and Cr 1.7.  TSH was 17.91 (high).  During the interim, patient has been fatigued. His wife notes that his day consists of eating and sleeping since he has a CVA back in 08/2017. Patient is followed by neurology. Wife states, "she seems to think the reason that he was sleeping so much was because of his brain healing".  Patient able to independently complete all of his ADLs, just slowed and with decreased stamina. Mood is stable. Patient denies depression. Patient is quiet by nature. He states, "if I have something to say, then I will say it".   Patient denies that he has experienced any B symptoms. He denies any interval infections. He has no appreciated any areas of bruising, bleeding, or palpable adenopathy. Patient was diagnosed with thyroid dysfunction by his PCP and was prescribed levothyroxine. Patient self discontinued his thyroid medication citing the fact that he "didn't feel like he needed it any longer". Until last week, patient had been off of the medication for "several months".   Patient advises that he maintains an adequate appetite. He is eating well. Weight today is 160 lb 13.2 oz (73 kg), which compared to his last visit to the clinic, represents a 6 pound increase.   Patient denies pain in the clinic today.   Past Medical History:  Diagnosis Date  . Diabetes mellitus without complication ([place])   . Prostate  cancer ([place])   . Thyroid disease     Past Surgical History:  Procedure Laterality Date  . BACK SURGERY  2008  . BACK SURGERY  1987  . BACK SURGERY  1984  . CHOLECYSTECTOMY    . HERNIA REPAIR  2004 and 2002  . NOSE SURGERY  1982  . PROSTATE SURGERY  1997    Family History  Problem Relation Age of Onset  . Cancer Brother     Social History:  reports that he has quit smoking. He has a 20.00 pack-year smoking history. He does not have any smokeless tobacco history on file. He reports previous alcohol use. He reports that he does not use drugs.  Former smoker 40 years ago. No alcohol intake. Past exposures of PCB (transformer oil). The patient is accompanied by his wife today.  Allergies:  Allergies  Allergen Reactions  . Sulfa Antibiotics Rash  . Simvastatin Other (See Comments)    Current Medications: Current Outpatient Medications  Medication Sig Dispense Refill  . aspirin (ASPIR-LOW) 81 MG EC tablet Take 81 mg by mouth daily.    . Blood Glucose Monitoring Suppl (FIFTY50 GLUCOSE METER 2.0) w/Device KIT Please check sugars once daily E11.9 Freestyle    . cetirizine (ZYRTEC) 10 MG tablet Take 10 mg by mouth daily.    . Coconut Oil 1000 MG CAPS Take by mouth daily.     Marland Kitchen desoximetasone (TOPICORT) 0.25 % cream desoximetasone 0.25 % topical cream    . fluocinonide cream (LIDEX) 0.05 % fluocinonide 0.05 %  topical cream    . fluorouracil (EFUDEX) 5 % cream fluorouracil 5 % topical cream    . fluticasone (FLONASE) 50 MCG/ACT nasal spray Place 1 spray into both nostrils daily.    . Glucosamine Sulfate 1000 MG CAPS Take 2,000 mg by mouth daily.    Marland Kitchen glucose blood test strip FreeStyle Lite Strips    . Lancets 28G MISC Lancets,Ultra Thin 26 gauge    . levothyroxine (SYNTHROID, LEVOTHROID) 75 MCG tablet Take 75 mcg by mouth daily.    . metFORMIN (GLUCOPHAGE) 500 MG tablet Take 500 mg by mouth daily.    . Multiple Vitamins-Minerals (CENTRUM SILVER PO) Take 1 tablet by mouth daily.    .  Omega-3 Fatty Acids (OMEGA-3 FISH OIL PO) Take 400 mg by mouth daily.    . rosuvastatin (CRESTOR) 40 MG tablet Take 40 mg by mouth daily.    . sildenafil (VIAGRA) 50 MG tablet Take 50 mg by mouth as needed.     No current facility-administered medications for this visit.     Review of Systems  Constitutional: Positive for malaise/fatigue. Negative for diaphoresis, fever and weight loss (up 6 pounds).  HENT: Positive for hearing loss. Negative for congestion, nosebleeds and sore throat.   Eyes: Negative.   Respiratory: Negative for cough, hemoptysis, sputum production and shortness of breath.   Cardiovascular: Negative for chest pain, palpitations, orthopnea, leg swelling and PND.  Gastrointestinal: Negative for abdominal pain, blood in stool, constipation, diarrhea, melena, nausea and vomiting.       Strangles easily  Genitourinary: Negative for dysuria, frequency, hematuria and urgency.  Musculoskeletal: Positive for joint pain. Negative for back pain, falls and myalgias.  Skin: Negative for itching and rash.  Neurological: Negative for dizziness, tremors, weakness and headaches.  Endo/Heme/Allergies: Does not bruise/bleed easily.       Diabetes and hypothyroidism.  Psychiatric/Behavioral: Negative for depression, memory loss and suicidal ideas. The patient is not nervous/anxious and does not have insomnia.        HYPERsomnia.  All other systems reviewed and are negative.  Performance status (ECOG): 1 - Symptomatic but completely ambulatory  Vital Signs: BP (!) 165/68 (BP Location: Left Arm, Patient Position: Sitting)   Pulse 63   Temp (!) 97.3 F (36.3 C) (Tympanic)   Resp 20   Wt 160 lb 13.2 oz (73 kg)   SpO2 97%   BMI 25.96 kg/m   Physical Exam  Constitutional: He is oriented to person, place, and time and well-developed, well-nourished, and in no distress.  HENT:  Head: Normocephalic and atraumatic.  Mouth/Throat: Oropharynx is clear and moist and mucous membranes are  normal. No oropharyngeal exudate.  Gray hair.  Hear aide.  Eyes: Pupils are equal, round, and reactive to light. Conjunctivae and EOM are normal. Left eye exhibits hordeolum. No scleral icterus.  Blue eyes.  Neck: Normal range of motion. Neck supple. No JVD present.  Cardiovascular: Normal rate, regular rhythm, normal heart sounds and intact distal pulses. Exam reveals no gallop and no friction rub.  No murmur heard. Pulmonary/Chest: Effort normal and breath sounds normal. No respiratory distress. He has no wheezes. He has no rales.  Abdominal: Soft. Bowel sounds are normal. He exhibits no distension and no mass. There is no abdominal tenderness. There is no rebound and no guarding.  Spleen tip palpable.  Musculoskeletal: Normal range of motion.        General: No tenderness or edema.  Lymphadenopathy:    He has no cervical adenopathy.  He has no axillary adenopathy.       Right: No inguinal and no supraclavicular adenopathy present.       Left: No inguinal and no supraclavicular adenopathy present.  Neurological: He is alert and oriented to person, place, and time. Gait normal.  Skin: Skin is warm and dry. No rash noted. No erythema. No pallor.  Psychiatric: Mood, affect and judgment normal.  Nursing note and vitals reviewed.    No visits with results within 3 Day(s) from this visit.  Latest known visit with results is:  Office Visit on 04/18/2018  Component Date Value Ref Range Status  . Tech Review 04/18/2018 MORPHOLOGY UNREMARKABLE   Final   Performed at [facility], [address] [place], [place] [address]  . Specimen Type 04/18/2018 BLOOD   Final  . Cells Counted 04/18/2018 200   Final  . Cells Analyzed 04/18/2018 200   Final  . FISH Result 04/18/2018 Comment:   Final   NORMAL:  NO BCR OR ABL GENE REARRANGEMENT OBSERVED  . Interpretation 04/18/2018 Comment:   Final   Comment: (NOTE)             nuc ish 9q34(ASS1,ABL1)x2,22q11.2(BCRx2)[200].      The  fluorescence in situ hybridization (FISH) study was normal. FISH, using unique sequence DNA probes for the ABL1 and BCR gene regions showed two ABL1 signals (red), two control ASS1 gene signals (aqua) located adjacent to the ABL1 locus at 9q34, and two BCR signals (green) at 22q11.2 in all interphase nuclei examined. There was NO evidence of CML or ALL-associated BCR/ABL1 dual fusion signals in this analysis. .      This analysis is limited to abnormalities detectable by the specific probes included in the study. FISH results should be interpreted within the context of a full cytogenetic analysis and pathology evaluation. .      This test was developed and its performance characteristics determined by [facility]). It has not been cleared or approved by the U.S. Food and Drug Administration. A BCR-ABL1 gene fusion in greater than 3 interphase nuclei from                           a patient with a new clinical diagnosis is considered positive. The DNA probe vendor for this study was [employer], research (physical sciences)).   . Director Review: 04/18/2018 Comment:   Final   Comment: (NOTE) Kirby Crigler, PHD, [place] Performed At: [facility] [address] [place], [place] [id number] Nechama Guard MD BS:[id number] Performed At: [facility] [address] [place], [place] [id number] Rush Farmer MD WS:[id number]   . aPTT 04/18/2018 32  24 - 36 seconds Final   Performed at [facility], [address] [place], [place] [address]  . Prothrombin Time 04/18/2018 12.8  11.4 - 15.2 seconds Final  . INR 04/18/2018 0.98   Final   Performed at [facility], [address], [place], [place] [address]  . HIV Screen 4th Generation wRfx 04/18/2018 Non Reactive  Non Reactive Final   Comment: (NOTE) Performed At: [facility] [place], [place] [id number] Rush Farmer MD ZL:[id number]   .  Hep B Core Total Ab 04/18/2018 Negative  Negative Final   Comment: (NOTE) Performed At: [facility] [place], [place] [id number] Rush Farmer MD AQ:[id number]   . HCV Ab 04/18/2018 <0.1  0.0 - 0.9 s/co  ratio Final   Comment: (NOTE)                                  Negative:     < 0.8                             Indeterminate: 0.8 - 0.9                                  Positive:     > 0.9 The CDC recommends that a positive HCV antibody result be followed up with a HCV Nucleic Acid Amplification test (921194). Performed At: [facility] [place], [place] [id number] Rush Farmer MD [id number]   . Anti Nuclear Antibody(ANA) 04/18/2018 Negative  Negative Final   Comment: (NOTE) Performed At: [facility] [place], [place] [id number] Rush Farmer MD DX:[id number]   . Copper 04/18/2018 90  72 - 166 ug/dL Final   Comment: (NOTE) This test was developed and its performance characteristics determined by [facility]. It has not been cleared or approved by the Food and Drug Administration.                                Detection Limit = 5 Performed At: [facility] [place], [place] [id number] Rush Farmer MD EZ:[id number]   . Folate 04/18/2018 70.3  >5.9 ng/mL Final   Comment: RESULT CONFIRMED BY MANUAL DILUTION AKT Performed at [facility], [address] [place], [place] [address]   . Vitamin B-12 04/18/2018 558  180 - 914 pg/mL Final   Comment: (NOTE) This assay is not validated for testing neonatal or myeloproliferative syndrome specimens for Vitamin B12 levels. Performed at [facility], [place] [address] [place], [place] [address]   . Platelet CT in [place] 04/18/2018 140   Final   Comment: PLATELET COUNT PERFORMED ON CITRATED BLOOD Performed at [facility], [address] [place], [place] [address]   . WBC 04/18/2018 8.8  3.8 - 10.6 K/uL Final   . RBC 04/18/2018 4.68  4.40 - 5.90 MIL/uL Final  . Hemoglobin 04/18/2018 14.5  13.0 - 18.0 g/dL Final  . HCT 04/18/2018 42.8  40.0 - 52.0 % Final  . MCV 04/18/2018 91.5  80.0 - 100.0 fL Final  . MCH 04/18/2018 30.9  26.0 - 34.0 pg Final  . MCHC 04/18/2018 33.8  32.0 - 36.0 g/dL Final  . RDW 04/18/2018 14.2  11.5 - 14.5 % Final  . Platelets 04/18/2018 143* 150 - 440 K/uL Final  . Neutrophils Relative % 04/18/2018 48  % Final  . Neutro Abs 04/18/2018 4.2  1.4 - 6.5 K/uL Final  . Lymphocytes Relative 04/18/2018 31  % Final  . Lymphs Abs 04/18/2018 2.7  1.0 - 3.6 K/uL Final  . Monocytes Relative 04/18/2018 17  % Final  . Monocytes Absolute 04/18/2018 1.5* 0.2 - 1.0 K/uL Final  . Eosinophils Relative 04/18/2018 3  % Final  . Eosinophils Absolute 04/18/2018 0.3  0 - 0.7 K/uL Final  . Basophils Relative 04/18/2018 1  % Final  . Basophils Absolute 04/18/2018 0.1  0 - 0.1 K/uL Final   Performed at [facility], [place]  [address] [place], [place] [address]    Assessment:  Zachary Wells is a 85 y.o. male with mild thrombocytopenia.  He has had intermittent thrombocytopenia dating back to 09/2014.  Platelet count has ranged between 102,000 - 245,000.  He has had persistent mild thrombocytopenia since 03/2018.  Platelet count was 146,000 on 03/13/2018 and 129,000 on 04/10/2018.  He has had persistent monocytosis since 2016.  Work-up on 04/18/2018 revealed the following normal studies:  PT, PTT, hepatitis B core antibody, hepatitis C antibody, HIV antibody, ANA with reflex, B12, folate, copper level, and BCR-ABL.   Platelet count in a blue top tube (citrated) was 140,000.  Peripheral smear was unremarkable.   Abdominal ultrasound on 04/24/2018 revealed no splenomegaly.  Liver and spleen were unremarkable.  He denies any new medications or herbal products.  He has received blood transfusions in the past.  He has no history of autoimmune disease or hepatitis.  Symptomatically, he remains  fatigued.  He spends majority of his day sleeping.  He is able to independently complete all of his ADLs, albeit slow and with decreased stamina.  He stopped his Synthroid "months ago".  Eating well; weight up 6 pounds.  Exam is grossly unremarkable.  Plan: 1. Review outside labs from 09/13/2018  WBC 9200, hemoglobin 13.2, hematocrit 39.7, MCV 91, and platelets 117,000. 2. Thrombocytopenia  Labs reviewed.  Platelets have ranged from 102,000 - 245,000.  Denies bruising or bleeding.   He has been off of thyroid medication, which could certainly be contributory.   Discuss possible underlying CMML and indications for treatment (B symptoms, symptomatic splenomegaly, worsening counts).  Plan for continued continued surveillance to allow for thyroid dysfunction to be corrected.  Discuss if fatigue and platelet count do not improve with a euthyroid state, bone marrow aspirate/biopsy would need to be considered for further evaluation.  Bone marrow procedure described.  Risks and benefits reviewed. 3. Thyroid dysfunction  Patient with marked fatigue and hypersomnolence.  TSH elevated at 17.91 on 09/13/2018.  No free T4 available for review.  Patient has been off of levothyroxine, until last week, for several months citing the fact that he did not feel like he needed it anymore.  Discuss implications of thyroid dysfunction on underlying thrombocytopenia.  Recently restarted levothyroxine.  Encouraged to follow-up with PCP, as already scheduled, for continued monitoring. 4. Hordeolum  Acute to LEFT eye.   No visual changes.  Discussed the use of warm compresses for symptomatic relief. 5. RTC in 3 months for MD assessment and labs (CBC with differential).   Honor Loh, NP  09/17/2018, 9:13 AM   I saw and evaluated the patient, participating in the key portions of the service and reviewing pertinent diagnostic studies and records.  I reviewed the nurse practitioner's note and agree  with the findings and the plan.  The assessment and plan were discussed with the patient.  Multiple questions were asked by the patient and answered.   Nolon Stalls, MD 09/17/2018, 9:13 AM

## 2018-09-17 ENCOUNTER — Inpatient Hospital Stay: Payer: Medicare Other | Attending: Hematology and Oncology | Admitting: Hematology and Oncology

## 2018-09-17 ENCOUNTER — Encounter: Payer: Self-pay | Admitting: Hematology and Oncology

## 2018-09-17 VITALS — BP 165/68 | HR 63 | Temp 97.3°F | Resp 20 | Wt 160.8 lb

## 2018-09-17 DIAGNOSIS — H00016 Hordeolum externum left eye, unspecified eyelid: Secondary | ICD-10-CM | POA: Insufficient documentation

## 2018-09-17 DIAGNOSIS — Z7984 Long term (current) use of oral hypoglycemic drugs: Secondary | ICD-10-CM | POA: Diagnosis not present

## 2018-09-17 DIAGNOSIS — Z8673 Personal history of transient ischemic attack (TIA), and cerebral infarction without residual deficits: Secondary | ICD-10-CM | POA: Diagnosis not present

## 2018-09-17 DIAGNOSIS — E119 Type 2 diabetes mellitus without complications: Secondary | ICD-10-CM

## 2018-09-17 DIAGNOSIS — Z8546 Personal history of malignant neoplasm of prostate: Secondary | ICD-10-CM | POA: Insufficient documentation

## 2018-09-17 DIAGNOSIS — Z87891 Personal history of nicotine dependence: Secondary | ICD-10-CM | POA: Insufficient documentation

## 2018-09-17 DIAGNOSIS — R5381 Other malaise: Secondary | ICD-10-CM | POA: Insufficient documentation

## 2018-09-17 DIAGNOSIS — R5383 Other fatigue: Secondary | ICD-10-CM | POA: Diagnosis not present

## 2018-09-17 DIAGNOSIS — Z79899 Other long term (current) drug therapy: Secondary | ICD-10-CM | POA: Diagnosis not present

## 2018-09-17 DIAGNOSIS — D72821 Monocytosis (symptomatic): Secondary | ICD-10-CM | POA: Insufficient documentation

## 2018-09-17 DIAGNOSIS — R946 Abnormal results of thyroid function studies: Secondary | ICD-10-CM | POA: Diagnosis not present

## 2018-09-17 DIAGNOSIS — H9190 Unspecified hearing loss, unspecified ear: Secondary | ICD-10-CM

## 2018-09-17 DIAGNOSIS — Z7982 Long term (current) use of aspirin: Secondary | ICD-10-CM | POA: Insufficient documentation

## 2018-09-17 DIAGNOSIS — D696 Thrombocytopenia, unspecified: Secondary | ICD-10-CM

## 2018-09-17 DIAGNOSIS — E079 Disorder of thyroid, unspecified: Secondary | ICD-10-CM

## 2018-09-17 NOTE — Progress Notes (Signed)
Pt here for follow up. Wife reports concerns with patient getting strangled with coughing. Denies other concerns.

## 2018-09-23 DIAGNOSIS — E079 Disorder of thyroid, unspecified: Secondary | ICD-10-CM | POA: Insufficient documentation

## 2018-09-23 DIAGNOSIS — R5383 Other fatigue: Secondary | ICD-10-CM | POA: Insufficient documentation

## 2018-10-05 ENCOUNTER — Other Ambulatory Visit: Payer: Self-pay | Admitting: Psychiatry

## 2018-10-05 ENCOUNTER — Other Ambulatory Visit (HOSPITAL_COMMUNITY): Payer: Self-pay | Admitting: Psychiatry

## 2018-10-05 DIAGNOSIS — N183 Chronic kidney disease, stage 3 unspecified: Secondary | ICD-10-CM

## 2018-10-08 ENCOUNTER — Ambulatory Visit
Admission: RE | Admit: 2018-10-08 | Discharge: 2018-10-08 | Disposition: A | Discharge status: Home / Self Care | Payer: Medicare Other | Source: Ambulatory Visit | Attending: Nephrology | Admitting: Nephrology

## 2018-10-08 DIAGNOSIS — N183 Chronic kidney disease, stage 3 unspecified: Secondary | ICD-10-CM

## 2018-10-09 ENCOUNTER — Other Ambulatory Visit: Payer: Self-pay | Admitting: Nephrology

## 2018-10-09 ENCOUNTER — Other Ambulatory Visit (HOSPITAL_COMMUNITY): Payer: Self-pay | Admitting: Nephrology

## 2018-10-09 DIAGNOSIS — N183 Chronic kidney disease, stage 3 unspecified: Secondary | ICD-10-CM

## 2018-10-22 ENCOUNTER — Telehealth: Payer: Self-pay

## 2018-10-22 ENCOUNTER — Other Ambulatory Visit: Payer: Self-pay

## 2018-10-22 NOTE — Telephone Encounter (Signed)
Called patient to do his travel screening, however, I had to speak to his wife-Hilda. Mrs. [name] was told that her husband was not able to bring any visitor to his appointment. Mrs. [name] stated that he was hard of hearing, therefore, she needed to come in with him. I told her that I would send a message to his provider and make that decision. Please contact patient's wife-Hilda and let her know if its okay for her to come in or not. Thank you.

## 2018-10-22 NOTE — Telephone Encounter (Signed)
I have spoken to Ms Zachary Wells to inform her that per Gaspar Bidding NP she is not able to come to the visit with her Husband due strick rules that we have in place due to the [place] virus. [name] didn't understand that she is not able to come in the office. I have explained to her that if [name] need to reschedule his appointment where she is able to attend the appointment with him, she has refused to cancaled his appointment / she states he need to be seen . I do agree on that but we can't allow any vistor with any patient in the facility at this time. Ms Zachary Wells has asked if she could be face time to understanding what is going on. Ms Zachary Wells has asked if someone could call her to inform her on.

## 2018-10-22 NOTE — Telephone Encounter (Signed)
  OK to bring wife as long as both are well.  M

## 2018-10-22 NOTE — Telephone Encounter (Signed)
Called Mrs. Netzley to let her know that per dr. Mike Gip, she was allowed to come in with her husband if she was healthy. Mrs. Knoedler stated that she was healthy and that she was grateful to hear that she was able to come in with her husband.  I will notify Dan Europe (front desk) know that Mrs. Diep could come back with patient.

## 2018-10-23 ENCOUNTER — Inpatient Hospital Stay: Payer: Medicare Other | Attending: Hematology and Oncology | Admitting: Hematology and Oncology

## 2018-10-23 ENCOUNTER — Other Ambulatory Visit: Payer: Medicare Other

## 2018-10-23 ENCOUNTER — Encounter: Payer: Self-pay | Admitting: Hematology and Oncology

## 2018-10-23 ENCOUNTER — Other Ambulatory Visit: Payer: Self-pay

## 2018-10-23 VITALS — BP 119/67 | HR 64 | Temp 97.5°F | Resp 18 | Ht 66.0 in | Wt 161.0 lb

## 2018-10-23 DIAGNOSIS — D696 Thrombocytopenia, unspecified: Secondary | ICD-10-CM | POA: Diagnosis present

## 2018-10-23 DIAGNOSIS — E119 Type 2 diabetes mellitus without complications: Secondary | ICD-10-CM | POA: Diagnosis not present

## 2018-10-23 DIAGNOSIS — Z79899 Other long term (current) drug therapy: Secondary | ICD-10-CM | POA: Diagnosis not present

## 2018-10-23 DIAGNOSIS — D72821 Monocytosis (symptomatic): Secondary | ICD-10-CM | POA: Insufficient documentation

## 2018-10-23 DIAGNOSIS — Z87891 Personal history of nicotine dependence: Secondary | ICD-10-CM | POA: Diagnosis not present

## 2018-10-23 DIAGNOSIS — E079 Disorder of thyroid, unspecified: Secondary | ICD-10-CM | POA: Diagnosis not present

## 2018-10-23 DIAGNOSIS — R778 Other specified abnormalities of plasma proteins: Secondary | ICD-10-CM

## 2018-10-23 DIAGNOSIS — Z8546 Personal history of malignant neoplasm of prostate: Secondary | ICD-10-CM | POA: Insufficient documentation

## 2018-10-23 DIAGNOSIS — Z7984 Long term (current) use of oral hypoglycemic drugs: Secondary | ICD-10-CM | POA: Diagnosis not present

## 2018-10-23 DIAGNOSIS — Z7982 Long term (current) use of aspirin: Secondary | ICD-10-CM | POA: Insufficient documentation

## 2018-10-23 LAB — COMPREHENSIVE METABOLIC PANEL
ALK PHOS: 82 U/L (ref 38–126)
ALT: 32 U/L (ref 0–44)
AST: 36 U/L (ref 15–41)
Albumin: 4.2 g/dL (ref 3.5–5.0)
Anion gap: 8 (ref 5–15)
BUN: 35 mg/dL — ABNORMAL HIGH (ref 8–23)
CO2: 25 mmol/L (ref 22–32)
Calcium: 9.5 mg/dL (ref 8.9–10.3)
Chloride: 103 mmol/L (ref 98–111)
Creatinine, Ser: 1.58 mg/dL — ABNORMAL HIGH (ref 0.61–1.24)
GFR calc Af Amer: 45 mL/min — ABNORMAL LOW (ref >60)
GFR calc non Af Amer: 39 mL/min — ABNORMAL LOW (ref >60)
Glucose, Bld: 233 mg/dL — ABNORMAL HIGH (ref 70–99)
Potassium: 4.3 mmol/L (ref 3.5–5.1)
Sodium: 136 mmol/L (ref 135–145)
Total Bilirubin: 0.9 mg/dL (ref 0.3–1.2)
Total Protein: 8.2 g/dL — ABNORMAL HIGH (ref 6.5–8.1)

## 2018-10-23 LAB — CBC WITH DIFFERENTIAL/PLATELET
Abs Immature Granulocytes: 0.02 10*3/uL (ref 0.00–0.07)
BASOS ABS: 0 10*3/uL (ref 0.0–0.1)
Basophils Relative: 1 %
Eosinophils Absolute: 0.3 10*3/uL (ref 0.0–0.5)
Eosinophils Relative: 4 %
HCT: 42.9 % (ref 39.0–52.0)
Hemoglobin: 14.5 g/dL (ref 13.0–17.0)
IMMATURE GRANULOCYTES: 0 %
LYMPHS ABS: 2.3 10*3/uL (ref 0.7–4.0)
Lymphocytes Relative: 31 %
MCH: 30.9 pg (ref 26.0–34.0)
MCHC: 33.8 g/dL (ref 30.0–36.0)
MCV: 91.5 fL (ref 80.0–100.0)
MONOS PCT: 13 %
Monocytes Absolute: 0.9 10*3/uL (ref 0.1–1.0)
NRBC: 0 % (ref 0.0–0.2)
Neutro Abs: 3.8 10*3/uL (ref 1.7–7.7)
Neutrophils Relative %: 51 %
Platelets: 138 10*3/uL — ABNORMAL LOW (ref 150–400)
RBC: 4.69 MIL/uL (ref 4.22–5.81)
RDW: 13.5 % (ref 11.5–15.5)
WBC: 7.3 10*3/uL (ref 4.0–10.5)

## 2018-10-23 NOTE — Progress Notes (Addendum)
[facility]     [address], [address]     [place], [place] [address]     Phone: [phone]      Fax: [phone]        Clinic day: 10/23/2018   Chief Complaint: Zachary Wells is a 85 y.o. male with thrombocytopenia and persistent monocytosis who is referred for evaluation regarding an abnormal SPEP.  HPI:  The patient was last seen in the hematology clinic on 09/17/2018. At that time,  he remains fatigued.  He spent the majority of his day sleeping.  He was able to independently complete all of his ADLs, albeit slow and with decreased stamina.  He stopped his Synthroid "months ago".  Eating well; weight up 6 pounds.  Exam was grossly unremarkable.    Counts at last visit included a hematocrit of 39.7, hemoglobin 13.2, MCV 91, platelets 117,000, and white count 9200.  He had a persistent monocytosis.  We discussed consideration of a bone marrow.  TSH was 17.91.  His fatigue was felt possibly due to hypothyroidism.  He was seen by Dr. Holley Raring.  Labs on 10/03/2018 included a hematocrit of 43.2, hemoglobin 14.2, platelets 139,000, white count 7600 with an [place] of 3511.  Absolute monocyte count was 1178.  Creatinine was 1.64.  Calcium was 9.8, protein 7.5, and albumin 4.2.  Random urine revealed an abnormal protein band in the gamma globulins and a possible second abnormal protein band in the beta globulins which may represent a monoclonal immunoglobulin.  SPEP revealed 0.6 g/dL restricted band in the gamma globulin region.  Immunofixation was recommended.  Rentral ultrasound on 10/08/2018 revealed increased renal parenchyma echogenicity with mild renal parenchymal thinning bilaterally which may reflect medical renal disease. There was no hydronephrosis.    Symptomatically, patient is doing well today. He does not make note of any acute or concerning symptoms. He feels generally well. Patient denies that he has experienced any B symptoms. He denies any interval  infections.  Patient advises that he maintains an adequate appetite. He is eating well. Weight today is 161 lb 0.7 oz (73.1 kg), which compared to his last visit to the clinic, represents a 1 pound increase.   Patient denies pain in the clinic today.   Past Medical History:  Diagnosis Date  . Diabetes mellitus without complication ([place])   . Prostate cancer ([place])   . Thyroid disease     Past Surgical History:  Procedure Laterality Date  . BACK SURGERY  2008  . BACK SURGERY  1987  . BACK SURGERY  1984  . CHOLECYSTECTOMY    . HERNIA REPAIR  2004 and 2002  . NOSE SURGERY  1982  . PROSTATE SURGERY  1997    Family History  Problem Relation Age of Onset  . Cancer Brother     Social History:  reports that he has quit smoking. He has a 20.00 pack-year smoking history. He has never used smokeless tobacco. He reports previous alcohol use. He reports that he does not use drugs.  Former smoker 40 years ago. No alcohol intake. Past exposures of PCB (transformer oil). The patient is accompanied by his wife today.  The audiologist from ENT arrived at the end of the visit.  Allergies:  Allergies  Allergen Reactions  . Sulfa Antibiotics Rash  . Simvastatin Other (See Comments)    Current Medications: Current Outpatient Medications  Medication Sig Dispense Refill  . aspirin (ASPIR-LOW) 81 MG EC tablet Take 81 mg  by mouth daily.    . Blood Glucose Monitoring Suppl (FIFTY50 GLUCOSE METER 2.0) w/Device KIT Please check sugars once daily E11.9 Freestyle    . cetirizine (ZYRTEC) 10 MG tablet Take 10 mg by mouth daily.    . Coconut Oil 1000 MG CAPS Take by mouth daily.     . fluticasone (FLONASE) 50 MCG/ACT nasal spray Place 1 spray into both nostrils daily.    . Glucosamine Sulfate 1000 MG CAPS Take 2,000 mg by mouth daily.    Marland Kitchen glucose blood test strip FreeStyle Lite Strips    . Lancets 28G MISC Lancets,Ultra Thin 26 gauge    . levothyroxine (SYNTHROID, LEVOTHROID) 75 MCG tablet Take 75  mcg by mouth daily.    . metFORMIN (GLUCOPHAGE) 500 MG tablet Take 500 mg by mouth daily.    . Multiple Vitamins-Minerals (CENTRUM SILVER PO) Take 1 tablet by mouth daily.    . Omega-3 Fatty Acids (OMEGA-3 FISH OIL PO) Take 400 mg by mouth daily.    . rosuvastatin (CRESTOR) 40 MG tablet Take 40 mg by mouth daily.    Marland Kitchen desoximetasone (TOPICORT) 0.25 % cream desoximetasone 0.25 % topical cream    . fluocinonide cream (LIDEX) 0.05 % fluocinonide 0.05 % topical cream    . fluorouracil (EFUDEX) 5 % cream fluorouracil 5 % topical cream    . sildenafil (VIAGRA) 50 MG tablet Take 50 mg by mouth as needed.     No current facility-administered medications for this visit.     Review of Systems  Constitutional: Positive for malaise/fatigue. Negative for diaphoresis, fever and weight loss (up 1 pound).  HENT: Positive for hearing loss. Negative for congestion, nosebleeds and sore throat.   Eyes: Negative.   Respiratory: Negative for cough, hemoptysis, sputum production and shortness of breath.   Cardiovascular: Negative for chest pain, palpitations, orthopnea, leg swelling and PND.  Gastrointestinal: Negative for abdominal pain, blood in stool, constipation, diarrhea, melena, nausea and vomiting.       (+) dysphagia - strangles easily  Genitourinary: Negative for dysuria, frequency, hematuria and urgency.  Musculoskeletal: Positive for joint pain (chronic secondary to OA). Negative for back pain, falls and myalgias.  Skin: Negative for itching and rash.  Neurological: Negative for dizziness, tremors, weakness and headaches.  Endo/Heme/Allergies: Does not bruise/bleed easily.       Diabetes and HYPOthyroidism.  Psychiatric/Behavioral: Negative for depression, memory loss and suicidal ideas. The patient is not nervous/anxious and does not have insomnia.        HYPERsomnia.  All other systems reviewed and are negative.  Performance status (ECOG): 1 - Symptomatic but completely ambulatory  Vital  Signs: BP 119/67 (Patient Position: Sitting)   Pulse 64   Temp (!) 97.5 F (36.4 C) (Tympanic)   Resp 18   Ht _0  (1.676 m)   Wt 161 lb 0.7 oz (73.1 kg)   SpO2 97%   BMI 25.99 kg/m   Physical Exam  Constitutional: He is oriented to person, place, and time and well-developed, well-nourished, and in no distress. No distress.  HENT:  Head: Normocephalic and atraumatic.  Right Ear: Decreased hearing is noted.  Left Ear: Decreased hearing is noted.  Gray hair.  Hearing aide.  Eyes: Conjunctivae and EOM are normal. No scleral icterus.  Neurological: He is alert and oriented to person, place, and time.  Skin: He is not diaphoretic.  Psychiatric: Mood, affect and judgment normal.  Nursing note and vitals reviewed.    Appointment on 10/23/2018  Component Date  Value Ref Range Status  . Sodium 10/23/2018 136  135 - 145 mmol/L Final  . Potassium 10/23/2018 4.3  3.5 - 5.1 mmol/L Final  . Chloride 10/23/2018 103  98 - 111 mmol/L Final  . CO2 10/23/2018 25  22 - 32 mmol/L Final  . Glucose, Bld 10/23/2018 233* 70 - 99 mg/dL Final  . BUN 10/23/2018 35* 8 - 23 mg/dL Final  . Creatinine, Ser 10/23/2018 1.58* 0.61 - 1.24 mg/dL Final  . Calcium 10/23/2018 9.5  8.9 - 10.3 mg/dL Final  . Total Protein 10/23/2018 8.2* 6.5 - 8.1 g/dL Final  . Albumin 10/23/2018 4.2  3.5 - 5.0 g/dL Final  . AST 10/23/2018 36  15 - 41 U/L Final  . ALT 10/23/2018 32  0 - 44 U/L Final  . Alkaline Phosphatase 10/23/2018 82  38 - 126 U/L Final  . Total Bilirubin 10/23/2018 0.9  0.3 - 1.2 mg/dL Final  . GFR calc non Af Amer 10/23/2018 39* >60 mL/min Final  . GFR calc Af Amer 10/23/2018 45* >60 mL/min Final  . Anion gap 10/23/2018 8  5 - 15 Final   Performed at [facility], [address], [place], [place] [address]  . WBC 10/23/2018 7.3  4.0 - 10.5 K/uL Final  . RBC 10/23/2018 4.69  4.22 - 5.81 MIL/uL Final  . Hemoglobin 10/23/2018 14.5  13.0 - 17.0 g/dL Final  . HCT 10/23/2018 42.9  39.0 - 52.0 %  Final  . MCV 10/23/2018 91.5  80.0 - 100.0 fL Final  . MCH 10/23/2018 30.9  26.0 - 34.0 pg Final  . MCHC 10/23/2018 33.8  30.0 - 36.0 g/dL Final  . RDW 10/23/2018 13.5  11.5 - 15.5 % Final  . Platelets 10/23/2018 138* 150 - 400 K/uL Final  . nRBC 10/23/2018 0.0  0.0 - 0.2 % Final  . Neutrophils Relative % 10/23/2018 51  % Final  . Neutro Abs 10/23/2018 3.8  1.7 - 7.7 K/uL Final  . Lymphocytes Relative 10/23/2018 31  % Final  . Lymphs Abs 10/23/2018 2.3  0.7 - 4.0 K/uL Final  . Monocytes Relative 10/23/2018 13  % Final  . Monocytes Absolute 10/23/2018 0.9  0.1 - 1.0 K/uL Final  . Eosinophils Relative 10/23/2018 4  % Final  . Eosinophils Absolute 10/23/2018 0.3  0.0 - 0.5 K/uL Final  . Basophils Relative 10/23/2018 1  % Final  . Basophils Absolute 10/23/2018 0.0  0.0 - 0.1 K/uL Final  . Immature Granulocytes 10/23/2018 0  % Final  . Abs Immature Granulocytes 10/23/2018 0.02  0.00 - 0.07 K/uL Final   Performed at [facility], [address], [place], [place] [address]    Assessment:  EION TIMBROOK is a 85 y.o. male with mild thrombocytopenia.  He has had intermittent thrombocytopenia dating back to 09/2014.  Platelet count has ranged between 102,000 - 245,000.  He has had persistent mild thrombocytopenia since 03/2018.  Platelet count was 146,000 on 03/13/2018 and 129,000 on 04/10/2018.  He has had persistent monocytosis since 2016.  Work-up on 04/18/2018 revealed the following normal studies:  PT, PTT, hepatitis B core antibody, hepatitis C antibody, HIV antibody, ANA with reflex, B12, folate, copper level, and BCR-ABL.   Platelet count in a blue top tube (citrated) was 140,000.  Peripheral smear was unremarkable.   Abdominal ultrasound on 04/24/2018 revealed no splenomegaly.  Liver and spleen were unremarkable.  SPEP on 10/03/2018 revealed 0.6 g/dL restricted band in the gamma globulin region.  Random urine on 10/03/2018 revealed an abnormal protein band in the gamma  globulins and a possible second abnormal protein band in the beta globulins which may represent a monoclonal immunoglobulin.   He denies any new medications or herbal products.  He has received blood transfusions in the past.  He has no history of autoimmune disease or hepatitis.  Symptomatically, he is doing well overall.  He denies any acute concerns.  No B symptoms or recent infections.  Denies increased bruising, bleeding, or palpable adenopathy.  Exam is grossly unremarkable.  WBC 7300 ([place] 3800).  Platelets 138,000.  BUN 35 and creatinine 1.58 mg/dL.  Plan: 1. Labs today:  CBC with diff, CMP, myeloma panel, FLCA, 24h UPEP + IFE 2. Abnormal/estricted protein band    SPEP revealed 0.6 g/dL restricted band in the gamma globulin region  (? monoclonal immunoglobulin).  Random urine revealed an abnormal protein band in the gamma globulins and possible beta globulins.   Discuss additional lab testing to assess for multiple myeloma.   Check myeloma panel, FLCA, and 24-hour UPEP + IFE.  Discuss possible need for bone marrow aspirate and biopsy.  3. Thrombocytopenia  Labs reviewed.  Platelets 138,000.  Concern for possible lymphoproliferative disorder (including myeloma) as secondary cause of thrombocytopenia have been further investigated.  Total protein level slightly elevated at 8.2 g/dL.   Viral (hepatitis and HIV) serologies negative.  No BCR-ABL gene mutation to suggest CML.   Citrated platelet count normal - no pseudothrombocytopenia.  Discuss need for further lab testing to further assess for multiple myeloma.   Check myeloma panel, FLCA, and 24-hour UPEP + IFE.  Discuss (+) findings of additional labs today will warrant need for bone marrow biopsy.  4. RTC in 10 days for MD assessment, review of work-up, and further discussion regarding direction of therapy.   Honor Loh, NP 10/23/2018, 9:39 AM   I saw and evaluated the patient, participating in the key portions of the  service and reviewing pertinent diagnostic studies and records.  I reviewed the nurse practitioner's note and agree with the findings and the plan.  The assessment and plan were discussed with the patient.  Several questions were asked by the patient and answered.   Nolon Stalls, MD 10/23/2018, 9:39 AM

## 2018-10-23 NOTE — Progress Notes (Signed)
No new changes noted today 

## 2018-10-24 ENCOUNTER — Other Ambulatory Visit: Payer: Medicare Other

## 2018-10-24 ENCOUNTER — Ambulatory Visit: Payer: Medicare Other | Admitting: Hematology and Oncology

## 2018-10-24 LAB — KAPPA/LAMBDA LIGHT CHAINS
Kappa free light chain: 99.5 mg/L — ABNORMAL HIGH (ref 3.3–19.4)
Kappa, lambda light chain ratio: 3.92 — ABNORMAL HIGH (ref 0.26–1.65)
Lambda free light chains: 25.4 mg/L (ref 5.7–26.3)

## 2018-10-25 ENCOUNTER — Other Ambulatory Visit: Payer: Self-pay

## 2018-10-25 DIAGNOSIS — D696 Thrombocytopenia, unspecified: Secondary | ICD-10-CM | POA: Diagnosis not present

## 2018-10-25 DIAGNOSIS — R778 Other specified abnormalities of plasma proteins: Secondary | ICD-10-CM

## 2018-10-25 DIAGNOSIS — D72821 Monocytosis (symptomatic): Secondary | ICD-10-CM

## 2018-10-25 LAB — MULTIPLE MYELOMA PANEL, SERUM
ALBUMIN/GLOB SERPL: 1.2 (ref 0.7–1.7)
Albumin SerPl Elph-Mcnc: 4 g/dL (ref 2.9–4.4)
Alpha 1: 0.2 g/dL (ref 0.0–0.4)
Alpha2 Glob SerPl Elph-Mcnc: 0.9 g/dL (ref 0.4–1.0)
B-Globulin SerPl Elph-Mcnc: 1.1 g/dL (ref 0.7–1.3)
Gamma Glob SerPl Elph-Mcnc: 1.4 g/dL (ref 0.4–1.8)
Globulin, Total: 3.6 g/dL (ref 2.2–3.9)
IgA: 434 mg/dL (ref 61–437)
IgG (Immunoglobin G), Serum: 1599 mg/dL (ref 700–1600)
IgM (Immunoglobulin M), Srm: 81 mg/dL (ref 15–143)
M Protein SerPl Elph-Mcnc: 0.8 g/dL — ABNORMAL HIGH
Total Protein ELP: 7.6 g/dL (ref 6.0–8.5)

## 2018-10-26 LAB — IFE+PROTEIN ELECTRO, 24-HR UR
% BETA, Urine: 20.3 %
ALPHA 1 URINE: 8.7 %
Albumin, U: 25.3 %
Alpha 2, Urine: 17.1 %
GAMMA GLOBULIN URINE: 28.6 %
M-SPIKE %, Urine: 8.9 % — ABNORMAL HIGH
M-Spike, Mg/24 Hr: 40 mg/24 hr — ABNORMAL HIGH
Total Protein, Urine-Ur/day: 451 mg/24 hr — ABNORMAL HIGH (ref 30–150)
Total Protein, Urine: 29.1 mg/dL
Total Volume: 1550

## 2018-11-01 ENCOUNTER — Other Ambulatory Visit: Payer: Self-pay

## 2018-11-02 ENCOUNTER — Inpatient Hospital Stay: Payer: Medicare Other | Attending: Hematology and Oncology | Admitting: Hematology and Oncology

## 2018-11-02 ENCOUNTER — Encounter: Payer: Self-pay | Admitting: Hematology and Oncology

## 2018-11-02 DIAGNOSIS — D472 Monoclonal gammopathy: Secondary | ICD-10-CM | POA: Diagnosis not present

## 2018-11-02 DIAGNOSIS — Z7984 Long term (current) use of oral hypoglycemic drugs: Secondary | ICD-10-CM | POA: Insufficient documentation

## 2018-11-02 DIAGNOSIS — Z79899 Other long term (current) drug therapy: Secondary | ICD-10-CM | POA: Insufficient documentation

## 2018-11-02 DIAGNOSIS — D72821 Monocytosis (symptomatic): Secondary | ICD-10-CM | POA: Insufficient documentation

## 2018-11-02 DIAGNOSIS — Z8546 Personal history of malignant neoplasm of prostate: Secondary | ICD-10-CM | POA: Insufficient documentation

## 2018-11-02 DIAGNOSIS — Z87891 Personal history of nicotine dependence: Secondary | ICD-10-CM | POA: Insufficient documentation

## 2018-11-02 DIAGNOSIS — R5383 Other fatigue: Secondary | ICD-10-CM | POA: Insufficient documentation

## 2018-11-02 DIAGNOSIS — N289 Disorder of kidney and ureter, unspecified: Secondary | ICD-10-CM | POA: Diagnosis not present

## 2018-11-02 DIAGNOSIS — D696 Thrombocytopenia, unspecified: Secondary | ICD-10-CM | POA: Insufficient documentation

## 2018-11-02 DIAGNOSIS — Z7982 Long term (current) use of aspirin: Secondary | ICD-10-CM | POA: Insufficient documentation

## 2018-11-02 DIAGNOSIS — E119 Type 2 diabetes mellitus without complications: Secondary | ICD-10-CM | POA: Insufficient documentation

## 2018-11-02 NOTE — Progress Notes (Signed)
[facility]  [address] [place], [place] [address] Phone: [phone]  Fax: [phone]   Telephone Office Visit:  11/02/2018  I connected with Zachary Wells on 11/02/18 at 3:17 PM EDT by telephone and verified that I was speaking with the correct person using 2 identifiers.  The patient was at home.  I discussed the limitations, risk, security and privacy concerns of performing an evaluation and management service by telephone and  the availability of in person appointments.  I also discussed with the patient that there may be a patient responsible charge related to this service.  The patient expressed understanding and agreed to proceed.    Chief Complaint: Zachary Wells is a 85 y.o. male with thrombocytopenia and persistent monocytosis who is evaluated for review of work-up and discussion regarding direction of therapy.  HPI:  The patient was last seen in the hematology clinic on 10/23/2018. At that time, he was doing well overall.  He denied any acute concerns.  No B symptoms or recent infections.  Denies increased bruising, bleeding, or palpable adenopathy.  Exam was grossly unremarkable.  WBC was 7300 ([place] 3800).  Platelets were 138,000.  BUN 35 and creatinine 1.58 mg/dL.  Work-up on 10/23/2018 revealed a hematocrit of 42.9, hemoglobin 14.5, platelets 138,000, white count 7300 with an ANC of 3800.  SPEP revealed a 0.8 gm/dL IgG monoclonal protein with kappa light chain specificity.  Kappa light chain 99.5, lambda free light chains 25.4, and ratio 3.92 (0.26 - 1.65).  IgG was 1599.  IgA and IgM were normal.  24-hour UPEP revealed 151 mg of protein/24 hours of which 40 mg/24 hours were Bence-Jones protein positive, kappa type.  Creatinine was 1.58.  Calcium 9.5, protein 8.2, albumin 4.2.  Liver function tests were normal.  During the interim, he denies any complaint.  His wife notes that he still sleeps a lot.  He has been doing better with his  fluids.  This morning, he drank 2 cups of coffee, small juice, 16 ounces of green tea, and water.  His swallowing is "ok".   He denies pain.   Past Medical History:  Diagnosis Date  . Diabetes mellitus without complication ([place])   . Prostate cancer ([place])   . Thyroid disease     Past Surgical History:  Procedure Laterality Date  . BACK SURGERY  2008  . BACK SURGERY  1987  . BACK SURGERY  1984  . CHOLECYSTECTOMY    . HERNIA REPAIR  2004 and 2002  . NOSE SURGERY  1982  . PROSTATE SURGERY  1997    Family History  Problem Relation Age of Onset  . Cancer Brother     Social History:  reports that he has quit smoking. He has a 20.00 pack-year smoking history. He has never used smokeless tobacco. He reports previous alcohol use. He reports that he does not use drugs.  Former smoker 40 years ago. No alcohol intake. Past exposures of PCB (transformer oil).   Participants in the patient's visit included the patient, his wife, and Vito Berger, CMA, today.  The intake visit was provided by Vito Berger, CMA.  Allergies:  Allergies  Allergen Reactions  . Sulfa Antibiotics Rash  . Simvastatin Other (See Comments)    Current Medications: Current Outpatient Medications  Medication Sig Dispense Refill  . aspirin (ASPIR-LOW) 81 MG EC tablet Take 81 mg by mouth daily.    . Blood Glucose Monitoring Suppl (FIFTY50 GLUCOSE METER 2.0) w/Device  KIT Please check sugars once daily E11.9 Freestyle    . cetirizine (ZYRTEC) 10 MG tablet Take 10 mg by mouth daily.    . Coconut Oil 1000 MG CAPS Take by mouth daily.     Marland Kitchen desoximetasone (TOPICORT) 0.25 % cream desoximetasone 0.25 % topical cream    . fluocinonide cream (LIDEX) 0.05 % fluocinonide 0.05 % topical cream    . fluorouracil (EFUDEX) 5 % cream fluorouracil 5 % topical cream    . fluticasone (FLONASE) 50 MCG/ACT nasal spray Place 1 spray into both nostrils daily.    . Glucosamine Sulfate 1000 MG CAPS Take 2,000 mg by mouth daily.     Marland Kitchen glucose blood test strip FreeStyle Lite Strips    . Lancets 28G MISC Lancets,Ultra Thin 26 gauge    . levothyroxine (SYNTHROID, LEVOTHROID) 75 MCG tablet Take 75 mcg by mouth daily.    . metFORMIN (GLUCOPHAGE) 500 MG tablet Take 500 mg by mouth daily.    . Multiple Vitamins-Minerals (CENTRUM SILVER PO) Take 1 tablet by mouth daily.    . Omega-3 Fatty Acids (OMEGA-3 FISH OIL PO) Take 400 mg by mouth daily.    . rosuvastatin (CRESTOR) 40 MG tablet Take 40 mg by mouth daily.    . sildenafil (VIAGRA) 50 MG tablet Take 50 mg by mouth as needed.     No current facility-administered medications for this visit.     Review of Systems  Constitutional: Positive for malaise/fatigue. Negative for diaphoresis, fever and weight loss.       No complaints.  HENT: Positive for hearing loss. Negative for congestion, ear pain, nosebleeds, sinus pain and sore throat.   Eyes: Negative.  Negative for blurred vision and double vision.  Respiratory: Negative.  Negative for cough, hemoptysis, sputum production and shortness of breath.   Cardiovascular: Negative.  Negative for chest pain, palpitations, orthopnea, leg swelling and PND.  Gastrointestinal: Negative for abdominal pain, blood in stool, constipation, diarrhea, melena, nausea and vomiting.       No swallowing difficulties.  Genitourinary: Negative.  Negative for dysuria, frequency, hematuria and urgency.       Drinking fluids better.  Musculoskeletal: Positive for joint pain (chronic secondary to osteoarthritis). Negative for back pain, falls, myalgias and neck pain.  Skin: Negative.  Negative for itching and rash.  Neurological: Negative for tremors, focal weakness, weakness and headaches.  Endo/Heme/Allergies: Does not bruise/bleed easily.       Diabetes and HYPOthyroidism.  Psychiatric/Behavioral: Negative for depression and memory loss. The patient is not nervous/anxious.        HYPERsomnia.  All other systems reviewed and are negative.   Performance status (ECOG): 2    No visits with results within 3 Day(s) from this visit.  Latest known visit with results is:  Orders Only on 10/25/2018  Component Date Value Ref Range Status  . Total Protein, Urine 10/25/2018 29.1  Not Estab. mg/dL Final  . Total Protein, Urine-Ur/day 10/25/2018 451* 30 - 150 mg/24 hr Final  . Albumin, U 10/25/2018 25.3  % Final  . ALPHA 1 URINE 10/25/2018 8.7  % Final  . Alpha 2, Urine 10/25/2018 17.1  % Final  . % BETA, Urine 10/25/2018 20.3  % Final  . GAMMA GLOBULIN URINE 10/25/2018 28.6  % Final  . M-SPIKE %, Urine 10/25/2018 8.9* Not Observed % Final  . M-Spike, Mg/24 Hr 10/25/2018 40* Not Observed mg/24 hr Final  . Immunofixation Result, Urine 10/25/2018 Comment   Final   Bence Ronnald Ramp Protein  positive; kappa type.  . Note: 10/25/2018 Comment   Final   Comment: (NOTE) Protein electrophoresis scan will follow via computer, mail, or courier delivery. Performed At: [facility] [place], [place] [id number] Rush Farmer MD [id number]   . Total Volume 10/25/2018 1,550   Final   Performed at [facility], [address], [place], [place] [address]    Assessment:  Zachary Wells is a 85 y.o. male with mild thrombocytopenia.  He has had intermittent thrombocytopenia dating back to 09/2014.  Platelet count has ranged between 102,000 - 245,000.  He has had persistent mild thrombocytopenia since 03/2018.  Platelet count was 146,000 on 03/13/2018 and 129,000 on 04/10/2018.  He has had persistent monocytosis since 2016.  Work-up on 04/18/2018 revealed the following normal studies:  PT, PTT, hepatitis B core antibody, hepatitis C antibody, HIV antibody, ANA with reflex, B12, folate, copper level, and BCR-ABL.   Platelet count in a blue top tube (citrated) was 140,000.  Peripheral smear was unremarkable.   Abdominal ultrasound on 04/24/2018 revealed no splenomegaly.  Liver and spleen were unremarkable.  Work-up on  10/23/2018 revealed a hematocrit of 42.9, hemoglobin 14.5, platelets 138,000, white count 7300 with an ANC of 3800.  SPEP revealed a 0.8 gm/dL IgG monoclonal protein with kappa light chain specificity.  Kappa light chain 99.5, lambda free light chains 25.4, and ratio 3.92 (0.26 - 1.65).  IgG was 1599.  IgA and IgM were normal.  24-hour UPEP revealed 151 mg of protein/24 hours of which 40 mg/24 hours were Bence-Jones protein positive, kappa type.  Creatinine was 1.58.  Calcium 9.5, protein 8.2, albumin 4.2.  Liver function tests were normal.  He denies any new medications or herbal products.  He has received blood transfusions in the past.  He has no history of autoimmune disease or hepatitis.  Symptomatically, he denies any complaints.  He is drinking fluids better.  He denies any bone pain.  He has had no issues with infections.  Plan: 1. Review work-up from 10/23/2018. 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 Discuss CRAB criteria:  elevated calcium, renal dysfunction, anemia, and bone lesions.  Hemoglobin and calcium are normal.  Unclear if increased creatinine is related to IgG gammopathy. 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  Discuss risk of transformation to a lymphoproliferative disorder/myeloma of 1%/year. Consider future bone survey. 3. Renal insufficiency  Unclear if related to monoclonal gammopathy.  Recheck BMP.  Consider renal ultrasound to r/o obstruction if creatinine remains elevated.  If renal function remains elevated without etiology, discuss bone marrow aspirate and biopsy (try to avoid with COVID-19). 4. Thrombocytopenia  Platelet count is mildly decreased.  No evidence of pseudothrombocytopenia.  Continue observation. 5.   Fatigue  Etiology unclear.  Possibly related to a marrow process.  Check TSH. 6.   RTC on 11/07/2018 for labs (BMP and TSH). 7.   RTC in 3 months for MD assessment and  labs (CBC with diff, CMP). 8.   Anticipate follow-up sooner if renal function has not improved.  I discussed the assessment and treatment plan with the patient.  The patient was provided an opportunity to ask questions and all were answered.  The patient agreed with the plan and demonstrated an understanding of the instructions.  The patient was advised to call back or seek an in person evaluation if the symptoms worsen or if the condition fails to improve as anticipated.  I provided 15  minutes (3:17 PM - 3:32 PM) of non-face-to-face time during this encounter.  I provided these services from the [facility] office.   Lequita Asal, MD, PhD  11/02/2018, 3:32 PM

## 2018-11-02 NOTE — Progress Notes (Signed)
No new changes noted today. The patient DOB, name, address has been verified with tele visit today.cbg, CMA

## 2018-11-06 ENCOUNTER — Other Ambulatory Visit: Payer: Self-pay

## 2018-11-07 ENCOUNTER — Inpatient Hospital Stay: Payer: Medicare Other

## 2018-11-07 DIAGNOSIS — R5383 Other fatigue: Secondary | ICD-10-CM | POA: Diagnosis not present

## 2018-11-07 DIAGNOSIS — Z8546 Personal history of malignant neoplasm of prostate: Secondary | ICD-10-CM | POA: Diagnosis not present

## 2018-11-07 DIAGNOSIS — D472 Monoclonal gammopathy: Secondary | ICD-10-CM | POA: Diagnosis present

## 2018-11-07 DIAGNOSIS — D696 Thrombocytopenia, unspecified: Secondary | ICD-10-CM

## 2018-11-07 DIAGNOSIS — E119 Type 2 diabetes mellitus without complications: Secondary | ICD-10-CM | POA: Diagnosis not present

## 2018-11-07 DIAGNOSIS — Z79899 Other long term (current) drug therapy: Secondary | ICD-10-CM | POA: Diagnosis not present

## 2018-11-07 DIAGNOSIS — Z7982 Long term (current) use of aspirin: Secondary | ICD-10-CM | POA: Diagnosis not present

## 2018-11-07 DIAGNOSIS — Z87891 Personal history of nicotine dependence: Secondary | ICD-10-CM | POA: Diagnosis not present

## 2018-11-07 DIAGNOSIS — N289 Disorder of kidney and ureter, unspecified: Secondary | ICD-10-CM | POA: Diagnosis not present

## 2018-11-07 DIAGNOSIS — Z7984 Long term (current) use of oral hypoglycemic drugs: Secondary | ICD-10-CM | POA: Diagnosis not present

## 2018-11-07 DIAGNOSIS — D72821 Monocytosis (symptomatic): Secondary | ICD-10-CM | POA: Diagnosis not present

## 2018-11-07 LAB — COMPREHENSIVE METABOLIC PANEL
ALT: 27 U/L (ref 0–44)
AST: 32 U/L (ref 15–41)
Albumin: 3.8 g/dL (ref 3.5–5.0)
Alkaline Phosphatase: 69 U/L (ref 38–126)
Anion gap: 7 (ref 5–15)
BUN: 28 mg/dL — ABNORMAL HIGH (ref 8–23)
CO2: 23 mmol/L (ref 22–32)
Calcium: 9.2 mg/dL (ref 8.9–10.3)
Chloride: 107 mmol/L (ref 98–111)
Creatinine, Ser: 1.62 mg/dL — ABNORMAL HIGH (ref 0.61–1.24)
GFR calc Af Amer: 44 mL/min — ABNORMAL LOW (ref >60)
GFR calc non Af Amer: 38 mL/min — ABNORMAL LOW (ref >60)
Glucose, Bld: 137 mg/dL — ABNORMAL HIGH (ref 70–99)
Potassium: 3.9 mmol/L (ref 3.5–5.1)
Sodium: 137 mmol/L (ref 135–145)
Total Bilirubin: 1 mg/dL (ref 0.3–1.2)
Total Protein: 7.7 g/dL (ref 6.5–8.1)

## 2018-11-07 LAB — TSH: TSH: 2.544 u[IU]/mL (ref 0.350–4.500)

## 2018-11-14 ENCOUNTER — Encounter: Payer: Self-pay | Admitting: Hematology and Oncology

## 2018-11-16 ENCOUNTER — Telehealth: Payer: Self-pay | Admitting: *Deleted

## 2018-11-16 ENCOUNTER — Telehealth: Payer: Self-pay

## 2018-11-16 ENCOUNTER — Telehealth: Payer: Self-pay | Admitting: Hematology and Oncology

## 2018-11-16 NOTE — Telephone Encounter (Signed)
Re:  Labs  Called to discuss lab results.  Creatinine is stable, but remains elevated.  Discuss plan to follow-up with nephrology, Dr Holley Raring.  Several questions asked and answered.   Lequita Asal, MD

## 2018-11-16 NOTE — Telephone Encounter (Signed)
-----   Message from Leighton Parody sent at 11/16/2018  1:42 PM EDT ----- Pt's wife called, she said that she needs to be called each time after the lab results and that "no news is good news" is not good enough for her... I told her I would relay the message and somebody would be in contact with her next week.  Thank you, [facility] Parents

## 2018-11-16 NOTE — Telephone Encounter (Signed)
Patients wife left vm and is requesting call back regarding recent lab tests/results.

## 2018-11-16 NOTE — Telephone Encounter (Signed)
Reviewed results with patient's wife. Wife questioning if patient will need additional testing. Informed patient I would contact her on Monday after consulting with Dr. Mike Gip. Wife verbalizes understanding and denies any further questions at this time.

## 2018-11-30 ENCOUNTER — Encounter: Payer: Self-pay | Admitting: Nephrology

## 2018-12-17 ENCOUNTER — Ambulatory Visit: Payer: Medicare Other | Admitting: Hematology and Oncology

## 2018-12-17 ENCOUNTER — Other Ambulatory Visit: Payer: Medicare Other

## 2019-01-31 ENCOUNTER — Other Ambulatory Visit: Payer: Self-pay

## 2019-01-31 DIAGNOSIS — N289 Disorder of kidney and ureter, unspecified: Secondary | ICD-10-CM

## 2019-01-31 DIAGNOSIS — D472 Monoclonal gammopathy: Secondary | ICD-10-CM

## 2019-02-03 NOTE — Progress Notes (Signed)
[facility]  [address], [address] [place] [address] Phone: [phone]  Fax: [phone]   Clinic Day:  02/04/2019  Referring physician: Hortencia Pilar, MD  Chief Complaint: Zachary Wells is a 85 y.o. male with thrombocytopenia and persistent monocytosis who is evaluated for 3 month assessment.  HPI: The patient was last seen in the hematology clinic on 11/02/2018. At that time, he denied any complaints except for fatigue.  He was drinking fluids better.  He denied any bone pain.  He had no issues with infections.  Labs on 11/07/2018: TSH 2.544. BUN 28 and creatinine 1.62. He was referred to Dr. Holley Raring in nephrology.  During the interim, he is doing "alright." His wife is his primary historian. She notes he takes naps in the morning and afternoon most days, waking up for meals and 4-5 hrs in the evening. He has had a decreased appetite and has been apathetic. He has not been able to play golf as much due to the fatigue. He notes he would rather be awake most of the day.  His mood is good.    Past Medical History:  Diagnosis Date   Diabetes mellitus without complication ([place])    Prostate cancer ([place])    Thyroid disease     Past Surgical History:  Procedure Laterality Date   BACK SURGERY  2008   [place]   CHOLECYSTECTOMY     HERNIA REPAIR  2004 and 2002   NOSE SURGERY  1982   PROSTATE SURGERY  1997    Family History  Problem Relation Age of Onset   Cancer Brother     Social History:  reports that he has quit smoking. He has a 20.00 pack-year smoking history. He has never used smokeless tobacco. He reports previous alcohol use. He reports that he does not use drugs. Former smoker 40 years ago. No alcohol intake. Past exposures of PCB (transformer oil).  He is alone today with his wife, Zachary Wells, over the phone [phone]).   Allergies:  Allergies  Allergen Reactions   Sulfa Antibiotics  Rash   Simvastatin Other (See Comments)    Current Medications: Current Outpatient Medications  Medication Sig Dispense Refill   aspirin (ASPIR-LOW) 81 MG EC tablet Take 81 mg by mouth daily.     Blood Glucose Monitoring Suppl (FIFTY50 GLUCOSE METER 2.0) w/Device KIT Please check sugars once daily E11.9 Freestyle     cetirizine (ZYRTEC) 10 MG tablet Take 10 mg by mouth daily.     Coconut Oil 1000 MG CAPS Take by mouth daily.      fluocinonide cream (LIDEX) 0.05 % fluocinonide 0.05 % topical cream     fluorouracil (EFUDEX) 5 % cream fluorouracil 5 % topical cream     fluticasone (FLONASE) 50 MCG/ACT nasal spray Place 1 spray into both nostrils daily.     Glucosamine Sulfate 1000 MG CAPS Take 2,000 mg by mouth daily.     glucose blood test strip FreeStyle Lite Strips     Lancets 28G MISC Lancets,Ultra Thin 26 gauge     levothyroxine (SYNTHROID, LEVOTHROID) 75 MCG tablet Take 75 mcg by mouth daily.     metFORMIN (GLUCOPHAGE) 500 MG tablet Take 500 mg by mouth daily.     Multiple Vitamins-Minerals (CENTRUM SILVER PO) Take 1 tablet by mouth daily.     Omega-3 Fatty Acids (OMEGA-3 FISH OIL PO) Take 400 mg by mouth daily.     rosuvastatin (  CRESTOR) 40 MG tablet Take 40 mg by mouth daily.     desoximetasone (TOPICORT) 0.25 % cream desoximetasone 0.25 % topical cream     sildenafil (VIAGRA) 50 MG tablet Take 50 mg by mouth as needed.     No current facility-administered medications for this visit.     Review of Systems  Constitutional: Positive for malaise/fatigue and weight loss (2lbs). Negative for chills, diaphoresis and fever.       Doing "alright."  HENT: Positive for hearing loss (hearing aids). Negative for congestion, ear pain, nosebleeds, sinus pain and sore throat.   Eyes: Negative.  Negative for blurred vision and double vision.  Respiratory: Negative.  Negative for cough, hemoptysis, sputum production and shortness of breath.   Cardiovascular: Negative.   Negative for chest pain, palpitations, orthopnea, leg swelling and PND.  Gastrointestinal: Negative for abdominal pain, blood in stool, constipation, diarrhea, melena, nausea and vomiting.       No swallowing difficulties.  Genitourinary: Negative.  Negative for dysuria, frequency, hematuria and urgency.  Musculoskeletal: Positive for joint pain (chronic secondary to osteoarthritis). Negative for back pain, falls, myalgias and neck pain.  Skin: Negative.  Negative for itching and rash.  Neurological: Negative for tremors, focal weakness, weakness and headaches.  Endo/Heme/Allergies: Does not bruise/bleed easily.       Diabetes and HYPOthyroidism.  Psychiatric/Behavioral: Negative for depression and memory loss. The patient is not nervous/anxious.        HYPERsomnia.  All other systems reviewed and are negative.  Performance status (ECOG): 2  Blood pressure (!) 162/68, pulse 63, temperature 97.7 F (36.5 C), temperature source Tympanic, resp. rate 18, height '5\' 6"'$  (1.676 m), weight 159 lb 15.1 oz (72.6 kg), SpO2 97 %.   Physical Exam  Constitutional: He is oriented to person, place, and time. He appears well-developed and well-nourished. No distress.  HENT:  Head: Normocephalic and atraumatic.  Mouth/Throat: Oropharynx is clear and moist. No oropharyngeal exudate.  White hair. Mask.  Eyes: Pupils are equal, round, and reactive to light. Conjunctivae and EOM are normal. No scleral icterus.  Neck: Normal range of motion. Neck supple.  Cardiovascular: Normal rate, regular rhythm and normal heart sounds.  No murmur heard. Pulmonary/Chest: Effort normal and breath sounds normal. No respiratory distress. He has no wheezes.  Abdominal: Soft. Bowel sounds are normal. He exhibits no distension. There is no hepatosplenomegaly. There is no abdominal tenderness.  Musculoskeletal: Normal range of motion.        General: No edema.  Lymphadenopathy:    He has no cervical adenopathy.    He has no  axillary adenopathy.       Right: No supraclavicular adenopathy present.       Left: No supraclavicular adenopathy present.  Neurological: He is alert and oriented to person, place, and time.  Skin: Skin is warm and dry. He is not diaphoretic. No erythema.  Psychiatric: He has a normal mood and affect. His behavior is normal. Judgment and thought content normal.  Nursing note and vitals reviewed.   Appointment on 02/04/2019  Component Date Value Ref Range Status   Sodium 02/04/2019 139  135 - 145 mmol/L Final   Potassium 02/04/2019 4.1  3.5 - 5.1 mmol/L Final   Chloride 02/04/2019 103  98 - 111 mmol/L Final   CO2 02/04/2019 28  22 - 32 mmol/L Final   Glucose, Bld 02/04/2019 131* 70 - 99 mg/dL Final   BUN 02/04/2019 25* 8 - 23 mg/dL Final   Creatinine, Ser  02/04/2019 1.45* 0.61 - 1.24 mg/dL Final   Calcium 02/04/2019 9.5  8.9 - 10.3 mg/dL Final   Total Protein 02/04/2019 7.6  6.5 - 8.1 g/dL Final   Albumin 02/04/2019 3.9  3.5 - 5.0 g/dL Final   AST 02/04/2019 34  15 - 41 U/L Final   ALT 02/04/2019 31  0 - 44 U/L Final   Alkaline Phosphatase 02/04/2019 88  38 - 126 U/L Final   Total Bilirubin 02/04/2019 1.1  0.3 - 1.2 mg/dL Final   GFR calc non Af Amer 02/04/2019 43* >60 mL/min Final   GFR calc Af Amer 02/04/2019 50* >60 mL/min Final   Anion gap 02/04/2019 8  5 - 15 Final   Performed at [facility], [address], [place], [place] [address]   WBC 02/04/2019 7.2  4.0 - 10.5 K/uL Final   RBC 02/04/2019 4.30  4.22 - 5.81 MIL/uL Final   Hemoglobin 02/04/2019 13.4  13.0 - 17.0 g/dL Final   HCT 02/04/2019 40.4  39.0 - 52.0 % Final   MCV 02/04/2019 94.0  80.0 - 100.0 fL Final   MCH 02/04/2019 31.2  26.0 - 34.0 pg Final   MCHC 02/04/2019 33.2  30.0 - 36.0 g/dL Final   RDW 02/04/2019 14.7  11.5 - 15.5 % Final   Platelets 02/04/2019 145* 150 - 400 K/uL Final   nRBC 02/04/2019 0.0  0.0 - 0.2 % Final   Neutrophils Relative % 02/04/2019 43  % Final     Neutro Abs 02/04/2019 3.2  1.7 - 7.7 K/uL Final   Lymphocytes Relative 02/04/2019 38  % Final   Lymphs Abs 02/04/2019 2.7  0.7 - 4.0 K/uL Final   Monocytes Relative 02/04/2019 13  % Final   Monocytes Absolute 02/04/2019 0.9  0.1 - 1.0 K/uL Final   Eosinophils Relative 02/04/2019 5  % Final   Eosinophils Absolute 02/04/2019 0.3  0.0 - 0.5 K/uL Final   Basophils Relative 02/04/2019 1  % Final   Basophils Absolute 02/04/2019 0.0  0.0 - 0.1 K/uL Final   Immature Granulocytes 02/04/2019 0  % Final   Abs Immature Granulocytes 02/04/2019 0.03  0.00 - 0.07 K/uL Final   Performed at [facility], [address] [place], [place] [address]    Assessment:  Zachary Wells is a 85 y.o. male with mild thrombocytopenia.  He has had intermittent thrombocytopenia dating back to 09/2014.  Platelet count has ranged between 102,000 - 245,000.  He has had persistent mild thrombocytopenia since 03/2018.  Platelet count was 146,000 on 03/13/2018 and 129,000 on 04/10/2018.  He has had persistent monocytosis since 2016.  Work-up on 04/18/2018 revealed the following normal studies:  PT, PTT, hepatitis B core antibody, hepatitis C antibody, HIV antibody, ANA with reflex, B12, folate, copper level, and BCR-ABL.   Platelet count in a blue top tube (citrated) was 140,000.  Peripheral smear was unremarkable.   Abdominal ultrasound on 04/24/2018 revealed no splenomegaly.  Liver and spleen were unremarkable.  Work-up on 10/23/2018 revealed a hematocrit of 42.9, hemoglobin 14.5, platelets 138,000, white count 7300 with an ANC of 3800.  SPEP revealed a 0.8 gm/dL IgG monoclonal protein with kappa light chain specificity.  Kappa light chain 99.5, lambda free light chains 25.4, and ratio 3.92 (0.26 - 1.65).  IgG was 1599.  IgA and IgM were normal.  24-hour UPEP revealed 151 mg of protein/24 hours of which 40 mg/24 hours were Bence-Jones protein positive, kappa type.  Creatinine was 1.58.  Calcium 9.5,  protein 8.2, albumin 4.2.  Liver function tests were normal.  He denies any new medications or herbal products.  He has received blood transfusions in the past.  He has no history of autoimmune disease or hepatitis.  Symptomatically, he denies any fevers, sweats or weight loss.  He naps frequently.  Plan: 1.   Labs today:  CBC with diff, CMP, SPEP, FLCA. 2.   IgG monoclonal gammopathy M spike is 0.5 g/dL today (improved). No CRAB criteria:  elevated calcium, renal dysfunction, anemia, and bone lesions.             Hemoglobin and calcium remain normal.             Creatinine improved today. 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             Risk of transformation to a lymphoproliferative disorder/myeloma of 1%/year. Discuss bone survey. 3.   Renal insufficiency       Creatinine 1.45 (slightly improved).  Unclear if related to monoclonal gammopathy.        If renal function remains elevated without etiology, address bone marrow. 4.   Thrombocytopenia             Platelet count is 145,000.              No evidence of pseudothrombocytopenia.             Continue to monitor. 5.   Fatigue             Patient's wife concerned about patient's chronic fatigue.             He is only up for a few hours a day.  TSH was normal on 11/07/2018. 6.   Bone survey. 7.   RTC in 2 months for MD assessment and labs (CBC with diff, CMP, SPEP, LDH, uric acid).  I discussed the assessment and treatment plan with the patient.  The patient was provided an opportunity to ask questions and all were answered.  The patient agreed with the plan and demonstrated an understanding of the instructions.  The patient was advised to call back if the symptoms worsen or if the condition fails to improve as anticipated.  I provided 20 minutes of face-to-face time during this this encounter and > 50% was spent counseling as documented under my assessment and plan.    Lequita Asal, MD, PhD    02/04/2019, 4:19 PM  I, Molly Dorshimer, am acting as Education administrator for [employer]. Mike Gip, MD, PhD.  I, Melissa C. Mike Gip, MD, have reviewed the above documentation for accuracy and completeness, and I agree with the above.

## 2019-02-04 ENCOUNTER — Inpatient Hospital Stay: Payer: Medicare Other | Attending: Hematology and Oncology | Admitting: Hematology and Oncology

## 2019-02-04 ENCOUNTER — Other Ambulatory Visit: Payer: Self-pay | Admitting: Hematology and Oncology

## 2019-02-04 ENCOUNTER — Other Ambulatory Visit: Payer: Self-pay

## 2019-02-04 ENCOUNTER — Encounter: Payer: Self-pay | Admitting: Hematology and Oncology

## 2019-02-04 ENCOUNTER — Inpatient Hospital Stay: Payer: Medicare Other

## 2019-02-04 VITALS — BP 162/68 | HR 63 | Temp 97.7°F | Resp 18 | Ht 66.0 in | Wt 159.9 lb

## 2019-02-04 DIAGNOSIS — R5383 Other fatigue: Secondary | ICD-10-CM | POA: Insufficient documentation

## 2019-02-04 DIAGNOSIS — E119 Type 2 diabetes mellitus without complications: Secondary | ICD-10-CM | POA: Diagnosis not present

## 2019-02-04 DIAGNOSIS — D696 Thrombocytopenia, unspecified: Secondary | ICD-10-CM

## 2019-02-04 DIAGNOSIS — E079 Disorder of thyroid, unspecified: Secondary | ICD-10-CM | POA: Insufficient documentation

## 2019-02-04 DIAGNOSIS — Z79899 Other long term (current) drug therapy: Secondary | ICD-10-CM

## 2019-02-04 DIAGNOSIS — R5381 Other malaise: Secondary | ICD-10-CM | POA: Insufficient documentation

## 2019-02-04 DIAGNOSIS — N289 Disorder of kidney and ureter, unspecified: Secondary | ICD-10-CM

## 2019-02-04 DIAGNOSIS — D72821 Monocytosis (symptomatic): Secondary | ICD-10-CM | POA: Diagnosis not present

## 2019-02-04 DIAGNOSIS — D472 Monoclonal gammopathy: Secondary | ICD-10-CM | POA: Insufficient documentation

## 2019-02-04 DIAGNOSIS — Z7982 Long term (current) use of aspirin: Secondary | ICD-10-CM | POA: Insufficient documentation

## 2019-02-04 DIAGNOSIS — Z87891 Personal history of nicotine dependence: Secondary | ICD-10-CM | POA: Diagnosis not present

## 2019-02-04 DIAGNOSIS — R634 Abnormal weight loss: Secondary | ICD-10-CM

## 2019-02-04 LAB — CBC WITH DIFFERENTIAL/PLATELET
Abs Immature Granulocytes: 0.03 10*3/uL (ref 0.00–0.07)
Basophils Absolute: 0 10*3/uL (ref 0.0–0.1)
Basophils Relative: 1 %
Eosinophils Absolute: 0.3 10*3/uL (ref 0.0–0.5)
Eosinophils Relative: 5 %
HCT: 40.4 % (ref 39.0–52.0)
Hemoglobin: 13.4 g/dL (ref 13.0–17.0)
Immature Granulocytes: 0 %
Lymphocytes Relative: 38 %
Lymphs Abs: 2.7 10*3/uL (ref 0.7–4.0)
MCH: 31.2 pg (ref 26.0–34.0)
MCHC: 33.2 g/dL (ref 30.0–36.0)
MCV: 94 fL (ref 80.0–100.0)
Monocytes Absolute: 0.9 10*3/uL (ref 0.1–1.0)
Monocytes Relative: 13 %
Neutro Abs: 3.2 10*3/uL (ref 1.7–7.7)
Neutrophils Relative %: 43 %
Platelets: 145 10*3/uL — ABNORMAL LOW (ref 150–400)
RBC: 4.3 MIL/uL (ref 4.22–5.81)
RDW: 14.7 % (ref 11.5–15.5)
WBC: 7.2 10*3/uL (ref 4.0–10.5)
nRBC: 0 % (ref 0.0–0.2)

## 2019-02-04 LAB — COMPREHENSIVE METABOLIC PANEL
ALT: 31 U/L (ref 0–44)
AST: 34 U/L (ref 15–41)
Albumin: 3.9 g/dL (ref 3.5–5.0)
Alkaline Phosphatase: 88 U/L (ref 38–126)
Anion gap: 8 (ref 5–15)
BUN: 25 mg/dL — ABNORMAL HIGH (ref 8–23)
CO2: 28 mmol/L (ref 22–32)
Calcium: 9.5 mg/dL (ref 8.9–10.3)
Chloride: 103 mmol/L (ref 98–111)
Creatinine, Ser: 1.45 mg/dL — ABNORMAL HIGH (ref 0.61–1.24)
GFR calc Af Amer: 50 mL/min — ABNORMAL LOW (ref >60)
GFR calc non Af Amer: 43 mL/min — ABNORMAL LOW (ref >60)
Glucose, Bld: 131 mg/dL — ABNORMAL HIGH (ref 70–99)
Potassium: 4.1 mmol/L (ref 3.5–5.1)
Sodium: 139 mmol/L (ref 135–145)
Total Bilirubin: 1.1 mg/dL (ref 0.3–1.2)
Total Protein: 7.6 g/dL (ref 6.5–8.1)

## 2019-02-05 LAB — KAPPA/LAMBDA LIGHT CHAINS
Kappa free light chain: 96.3 mg/L — ABNORMAL HIGH (ref 3.3–19.4)
Kappa, lambda light chain ratio: 3.87 — ABNORMAL HIGH (ref 0.26–1.65)
Lambda free light chains: 24.9 mg/L (ref 5.7–26.3)

## 2019-02-06 LAB — PROTEIN ELECTROPHORESIS, SERUM
A/G Ratio: 1.1 (ref 0.7–1.7)
Albumin ELP: 3.7 g/dL (ref 2.9–4.4)
Alpha-1-Globulin: 0.2 g/dL (ref 0.0–0.4)
Alpha-2-Globulin: 0.7 g/dL (ref 0.4–1.0)
Beta Globulin: 1.1 g/dL (ref 0.7–1.3)
Gamma Globulin: 1.3 g/dL (ref 0.4–1.8)
Globulin, Total: 3.3 g/dL (ref 2.2–3.9)
M-Spike, %: 0.5 g/dL — ABNORMAL HIGH
Total Protein ELP: 7 g/dL (ref 6.0–8.5)

## 2019-02-25 ENCOUNTER — Ambulatory Visit
Admission: RE | Admit: 2019-02-25 | Discharge: 2019-02-25 | Disposition: A | Discharge status: Home / Self Care | Payer: Medicare Other | Attending: Hematology and Oncology | Admitting: Hematology and Oncology

## 2019-02-25 ENCOUNTER — Ambulatory Visit
Admission: RE | Admit: 2019-02-25 | Discharge: 2019-02-25 | Disposition: A | Discharge status: Home / Self Care | Payer: Medicare Other | Source: Ambulatory Visit | Attending: Hematology and Oncology | Admitting: Hematology and Oncology

## 2019-02-25 ENCOUNTER — Other Ambulatory Visit: Payer: Self-pay

## 2019-02-25 DIAGNOSIS — D472 Monoclonal gammopathy: Secondary | ICD-10-CM

## 2019-03-31 NOTE — Progress Notes (Signed)
[facility]  [address], [address] [place], [place] [address] Phone: [phone]  Fax: [phone]   Clinic Day:  04/01/2019  Referring physician: Hortencia Pilar, MD  Chief Complaint: Zachary Wells is a 85 y.o. male with an IgG monoclonal gammopathy, thrombocytopenia and persistent monocytosis who isevaluated for 2 month assessment.  HPI: The patient was last seen in the hematology clinic on 02/04/2019. At that time, he denied any fevers, sweats or weight loss.  He naped frequently.  Hematocrit was 40.4, hemoglobin 13.4, MCV 94, platelets 145,000, white count 7200 with an ANC of 3200.  M spike was 0.5 gm/dL.  Creatinine was 1.45.  Calcium was 9.5 with an albumin of 3.9.  Bone survey on 02/25/2019 revealed no evidence of lytic or sclerotic bone lesion.  There were no acute findings. There was cervical and lumbar spondylosis, remote healed left pubic rami fractures, and atherosclerosis.  During the interim, he has felt pretty good".  He denies any new symptoms.  He states that he still wants to sleep a lot.  His wife notes that he has been sleeping more since his CVA.  Some days are better than others.  He states that he sleeps more after he does things.   Past Medical History:  Diagnosis Date  . Diabetes mellitus without complication ([place])   . Prostate cancer ([place])   . Thyroid disease     Past Surgical History:  Procedure Laterality Date  . BACK SURGERY  2008  . BACK SURGERY  1987  . BACK SURGERY  1984  . CHOLECYSTECTOMY    . HERNIA REPAIR  2004 and 2002  . NOSE SURGERY  1982  . PROSTATE SURGERY  1997    Family History  Problem Relation Age of Onset  . Cancer Brother     Social History:  reports that he has quit smoking. He has a 20.00 pack-year smoking history. He has never used smokeless tobacco. He reports previous alcohol use. He reports that he does not use drugs. Former smoker 40 years ago. No alcohol intake. Past exposures of PCB  (transformer oil).  He is alone today with his wife, [name], on the [place].   Allergies:  Allergies  Allergen Reactions  . Sulfa Antibiotics Rash  . Simvastatin Other (See Comments)    Current Medications: Current Outpatient Medications  Medication Sig Dispense Refill  . aspirin (ASPIR-LOW) 81 MG EC tablet Take 81 mg by mouth daily.    . Blood Glucose Monitoring Suppl (FIFTY50 GLUCOSE METER 2.0) w/Device KIT Please check sugars once daily E11.9 Freestyle    . cetirizine (ZYRTEC) 10 MG tablet Take 10 mg by mouth daily.    . Coconut Oil 1000 MG CAPS Take by mouth daily.     Marland Kitchen desoximetasone (TOPICORT) 0.25 % cream desoximetasone 0.25 % topical cream    . fluocinonide cream (LIDEX) 0.05 % fluocinonide 0.05 % topical cream    . fluorouracil (EFUDEX) 5 % cream fluorouracil 5 % topical cream    . fluticasone (FLONASE) 50 MCG/ACT nasal spray Place 1 spray into both nostrils daily.    . Glucosamine Sulfate 1000 MG CAPS Take 2,000 mg by mouth daily.    Marland Kitchen glucose blood test strip FreeStyle Lite Strips    . Lancets 28G MISC Lancets,Ultra Thin 26 gauge    . levothyroxine (SYNTHROID, LEVOTHROID) 75 MCG tablet Take 75 mcg by mouth daily.    . metFORMIN (GLUCOPHAGE) 500 MG tablet Take 500 mg by mouth daily.    Marland Kitchen  Multiple Vitamins-Minerals (CENTRUM SILVER PO) Take 1 tablet by mouth daily.    . Omega-3 Fatty Acids (OMEGA-3 FISH OIL PO) Take 400 mg by mouth daily.    . rosuvastatin (CRESTOR) 40 MG tablet Take 40 mg by mouth daily.    . sildenafil (VIAGRA) 50 MG tablet Take 50 mg by mouth as needed.     No current facility-administered medications for this visit.     Review of Systems  Constitutional: Negative for chills, diaphoresis, fever, malaise/fatigue and weight loss (stable).       Feels "pretty good".  HENT: Positive for hearing loss (hearing aids). Negative for congestion, ear pain, nosebleeds, sinus pain and sore throat.   Eyes: Negative.  Negative for blurred vision, double vision and  photophobia.  Respiratory: Negative.  Negative for cough, hemoptysis, sputum production and shortness of breath.   Cardiovascular: Negative.  Negative for chest pain, palpitations, orthopnea, leg swelling and PND.  Gastrointestinal: Negative.  Negative for abdominal pain, blood in stool, constipation, diarrhea, melena, nausea and vomiting.       No dysphagia.  Genitourinary: Negative.  Negative for dysuria, frequency, hematuria and urgency.  Musculoskeletal: Positive for falls and joint pain (chronic secondary to osteoarthritis). Negative for back pain, myalgias and neck pain.  Skin: Negative.  Negative for itching and rash.  Neurological: Negative for tremors, speech change, focal weakness, seizures, weakness and headaches.  Endo/Heme/Allergies: Does not bruise/bleed easily.       Diabetes and HYPOthyroidism.  Psychiatric/Behavioral: Negative for depression and memory loss. The patient is not nervous/anxious.        HYPERsomnia- some days better than others.  All other systems reviewed and are negative.  Performance status (ECOG):  2  Vitals Blood pressure (!) 149/70, pulse 60, temperature 97.9 F (36.6 C), resp. rate 18, height 5' 6"  (1.676 m), weight 159 lb 2.8 oz (72.2 kg), SpO2 97 %.   Physical Exam  Constitutional: He is oriented to person, place, and time. He appears well-developed and well-nourished. No distress.  Elderly gentleman; he needs assistance off the table.  HENT:  Head: Normocephalic and atraumatic.  Mouth/Throat: Oropharynx is clear and moist. No oropharyngeal exudate.  Short gray hair.  Hearing aid.  Mask.  Eyes: Pupils are equal, round, and reactive to light. Conjunctivae and EOM are normal. No scleral icterus.  Neck: Normal range of motion. Neck supple. No JVD present.  Cardiovascular: Normal rate, regular rhythm and normal heart sounds.  No murmur heard. Pulmonary/Chest: Effort normal and breath sounds normal. No respiratory distress. He has no wheezes. He has  no rales.  Abdominal: Soft. Bowel sounds are normal. He exhibits no distension and no mass. There is no hepatosplenomegaly. There is no abdominal tenderness. There is no rebound and no guarding.  Musculoskeletal: Normal range of motion.        General: No edema.  Lymphadenopathy:    He has no cervical adenopathy.    He has no axillary adenopathy.       Right: No supraclavicular adenopathy present.       Left: No supraclavicular adenopathy present.  Neurological: He is alert and oriented to person, place, and time.  Skin: Skin is warm and dry. He is not diaphoretic. No erythema.  Right lower extremity dressing s/p fall.  Psychiatric: He has a normal mood and affect. His behavior is normal. Judgment and thought content normal.  Nursing note and vitals reviewed.   Appointment on 04/01/2019  Component Date Value Ref Range Status  . Uric Acid,  Serum 04/01/2019 5.2  3.7 - 8.6 mg/dL Final   Performed at [facility], [address] [place], [place] [address]  . LDH 04/01/2019 180  98 - 192 U/L Final   Performed at [facility], [address], [place], [place] [address]  . Sodium 04/01/2019 135  135 - 145 mmol/L Final  . Potassium 04/01/2019 4.3  3.5 - 5.1 mmol/L Final  . Chloride 04/01/2019 102  98 - 111 mmol/L Final  . CO2 04/01/2019 25  22 - 32 mmol/L Final  . Glucose, Bld 04/01/2019 139* 70 - 99 mg/dL Final  . BUN 04/01/2019 27* 8 - 23 mg/dL Final  . Creatinine, Ser 04/01/2019 1.60* 0.61 - 1.24 mg/dL Final  . Calcium 04/01/2019 9.6  8.9 - 10.3 mg/dL Final  . Total Protein 04/01/2019 7.8  6.5 - 8.1 g/dL Final  . Albumin 04/01/2019 3.9  3.5 - 5.0 g/dL Final  . AST 04/01/2019 33  15 - 41 U/L Final  . ALT 04/01/2019 24  0 - 44 U/L Final  . Alkaline Phosphatase 04/01/2019 79  38 - 126 U/L Final  . Total Bilirubin 04/01/2019 0.9  0.3 - 1.2 mg/dL Final  . GFR calc non Af Amer 04/01/2019 38* >60 mL/min Final  . GFR calc Af Amer 04/01/2019 44* >60 mL/min Final  .  Anion gap 04/01/2019 8  5 - 15 Final   Performed at [facility], [address], [place], [place] [address]  . WBC 04/01/2019 9.9  4.0 - 10.5 K/uL Final  . RBC 04/01/2019 4.60  4.22 - 5.81 MIL/uL Final  . Hemoglobin 04/01/2019 14.0  13.0 - 17.0 g/dL Final  . HCT 04/01/2019 41.9  39.0 - 52.0 % Final  . MCV 04/01/2019 91.1  80.0 - 100.0 fL Final  . MCH 04/01/2019 30.4  26.0 - 34.0 pg Final  . MCHC 04/01/2019 33.4  30.0 - 36.0 g/dL Final  . RDW 04/01/2019 13.7  11.5 - 15.5 % Final  . Platelets 04/01/2019 146* 150 - 400 K/uL Final  . nRBC 04/01/2019 0.0  0.0 - 0.2 % Final  . Neutrophils Relative % 04/01/2019 41  % Final  . Neutro Abs 04/01/2019 4.0  1.7 - 7.7 K/uL Final  . Lymphocytes Relative 04/01/2019 40  % Final  . Lymphs Abs 04/01/2019 4.0  0.7 - 4.0 K/uL Final  . Monocytes Relative 04/01/2019 14  % Final  . Monocytes Absolute 04/01/2019 1.4* 0.1 - 1.0 K/uL Final  . Eosinophils Relative 04/01/2019 4  % Final  . Eosinophils Absolute 04/01/2019 0.4  0.0 - 0.5 K/uL Final  . Basophils Relative 04/01/2019 1  % Final  . Basophils Absolute 04/01/2019 0.1  0.0 - 0.1 K/uL Final  . Immature Granulocytes 04/01/2019 0  % Final  . Abs Immature Granulocytes 04/01/2019 0.02  0.00 - 0.07 K/uL Final   Performed at [facility], [address] [place], [place] [address]    Assessment:  PHARAOH PIO is a 85 y.o. male with mild thrombocytopenia. He has had intermittent thrombocytopenia dating back to 09/2014. Platelet count has ranged between 102,000 - 245,000. He has had persistent mild thrombocytopenia since 03/2018. Platelet count was 146,000 on 03/13/2018 and 129,000 on 04/10/2018. He has had persistent monocytosissince 2016.  Work-up on 09/18/2019revealed the following normal studies: PT, PTT, hepatitis B core antibody, hepatitis C antibody, HIV antibody, ANA with reflex, B12, folate, copper level, and BCR-ABL. Platelet count in a blue top tube (citrated)  was  140,000. Peripheral smearwas unremarkable.   Abdominal ultrasound on 04/24/2018 revealed no splenomegaly. Liver and spleen were unremarkable.  Work-up on 03/24/2020revealed a hematocrit of 42.9, hemoglobin 14.5, platelets 138,000, white count 7300 with an ANC of 3800. SPEPrevealed a 0.8 gm/dLIgG monoclonal protein with kappa light chain specificity. Kappa light chain 99.5, lambda free light chains 25.4,and ratio 3.92 (0.26 -1.65).IgG was 1599.IgA and IgM were normal. 24-hour UPEPrevealed 151 mg of protein/24 hours of which 40 mg/24 hourswereBence-Jones protein positive, kappa type. Creatinine was 1.58. Calcium 9.5, protein 8.2, albumin 4.2. Liver functiontests were normal.  He has a monoclonal gammopathy of unknown significance (MGUS).    SPEP has been followed (gm/dL): 0.8 on 10/23/2018, 0.5 on 02/04/2019, and 0.5 on 04/01/2019.  Bone survey on 02/25/2019 revealed no evidence of lytic or sclerotic bone lesion.  There were no acute findings. There was cervical and lumbar spondylosis, remote healed left pubic rami fractures, and atherosclerosis.  He denies any new medications or herbal products. He has received blood transfusions in the past. He has no history of autoimmune disease or hepatitis.  Symptomatically, he feels "pretty good".  He continues to have intermittent hypersomnia.  Exam is stable.  Hemoglobin is 14.0.  Platelets 146,000.  Creatinine 1.6.  Plan: 1.   Labs today:  CBC with diff, CMP, LDH, uric acid, SPEP.  2.   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 No CRAB criteria: elevated calcium, renal dysfunction, anemia, and bone lesions. Hemoglobin and calcium remain normal. Creatinine fluctuates but is relatively stable.  Bone survey on 02/25/2019 revealed no lytic lesions. Etiology is felt secondary to MGUS.  Continue to monitor. 3.   Renal insufficiency Creatinine 1.60 today.        Creatinine has ranged between 1.58 - 1.60 since 09/2018.  Creatinine ranged between 0.86 - 1.4 from 12/09/2018 - 12/15/2008.  Patient is followed by Dr Holley Raring. 4.   Thrombocytopenia Platelet count is 146,000.  Continue to monitor. 5. Fatigue Patient has had chronic fatigue since his CVA.             TSH was normal on 11/07/2018.  With increased activity, he then has periods of fatigue.  Continue to monitor. 6. RTC in 6 months for MD assessment and labs (CBC with differential, CMP, SPEP, FLCA, LDH).  I discussed the assessment and treatment plan with the patient.  The patient was provided an opportunity to ask questions and all were answered.  The patient agreed with the plan and demonstrated an understanding of the instructions.  The patient was advised to call back if the symptoms worsen or if the condition fails to improve as anticipated.   Lequita Asal, MD, PhD    04/01/2019, 3:52 PM

## 2019-04-01 ENCOUNTER — Inpatient Hospital Stay: Payer: Medicare Other

## 2019-04-01 ENCOUNTER — Inpatient Hospital Stay: Payer: Medicare Other | Attending: Hematology and Oncology | Admitting: Hematology and Oncology

## 2019-04-01 ENCOUNTER — Other Ambulatory Visit: Payer: Self-pay

## 2019-04-01 ENCOUNTER — Encounter: Payer: Self-pay | Admitting: Hematology and Oncology

## 2019-04-01 VITALS — BP 149/70 | HR 60 | Temp 97.9°F | Resp 18 | Ht 66.0 in | Wt 159.2 lb

## 2019-04-01 DIAGNOSIS — R5383 Other fatigue: Secondary | ICD-10-CM | POA: Diagnosis not present

## 2019-04-01 DIAGNOSIS — D72821 Monocytosis (symptomatic): Secondary | ICD-10-CM | POA: Insufficient documentation

## 2019-04-01 DIAGNOSIS — Z87891 Personal history of nicotine dependence: Secondary | ICD-10-CM | POA: Insufficient documentation

## 2019-04-01 DIAGNOSIS — E079 Disorder of thyroid, unspecified: Secondary | ICD-10-CM | POA: Insufficient documentation

## 2019-04-01 DIAGNOSIS — E119 Type 2 diabetes mellitus without complications: Secondary | ICD-10-CM | POA: Insufficient documentation

## 2019-04-01 DIAGNOSIS — G4710 Hypersomnia, unspecified: Secondary | ICD-10-CM | POA: Insufficient documentation

## 2019-04-01 DIAGNOSIS — D696 Thrombocytopenia, unspecified: Secondary | ICD-10-CM | POA: Diagnosis not present

## 2019-04-01 DIAGNOSIS — Z8673 Personal history of transient ischemic attack (TIA), and cerebral infarction without residual deficits: Secondary | ICD-10-CM | POA: Diagnosis not present

## 2019-04-01 DIAGNOSIS — Z7982 Long term (current) use of aspirin: Secondary | ICD-10-CM | POA: Insufficient documentation

## 2019-04-01 DIAGNOSIS — N289 Disorder of kidney and ureter, unspecified: Secondary | ICD-10-CM

## 2019-04-01 DIAGNOSIS — Z79899 Other long term (current) drug therapy: Secondary | ICD-10-CM | POA: Insufficient documentation

## 2019-04-01 DIAGNOSIS — Z8546 Personal history of malignant neoplasm of prostate: Secondary | ICD-10-CM | POA: Diagnosis not present

## 2019-04-01 DIAGNOSIS — D472 Monoclonal gammopathy: Secondary | ICD-10-CM | POA: Diagnosis not present

## 2019-04-01 DIAGNOSIS — R5382 Chronic fatigue, unspecified: Secondary | ICD-10-CM | POA: Insufficient documentation

## 2019-04-01 DIAGNOSIS — Z7984 Long term (current) use of oral hypoglycemic drugs: Secondary | ICD-10-CM | POA: Diagnosis not present

## 2019-04-01 LAB — COMPREHENSIVE METABOLIC PANEL
ALT: 24 U/L (ref 0–44)
AST: 33 U/L (ref 15–41)
Albumin: 3.9 g/dL (ref 3.5–5.0)
Alkaline Phosphatase: 79 U/L (ref 38–126)
Anion gap: 8 (ref 5–15)
BUN: 27 mg/dL — ABNORMAL HIGH (ref 8–23)
CO2: 25 mmol/L (ref 22–32)
Calcium: 9.6 mg/dL (ref 8.9–10.3)
Chloride: 102 mmol/L (ref 98–111)
Creatinine, Ser: 1.6 mg/dL — ABNORMAL HIGH (ref 0.61–1.24)
GFR calc Af Amer: 44 mL/min — ABNORMAL LOW (ref >60)
GFR calc non Af Amer: 38 mL/min — ABNORMAL LOW (ref >60)
Glucose, Bld: 139 mg/dL — ABNORMAL HIGH (ref 70–99)
Potassium: 4.3 mmol/L (ref 3.5–5.1)
Sodium: 135 mmol/L (ref 135–145)
Total Bilirubin: 0.9 mg/dL (ref 0.3–1.2)
Total Protein: 7.8 g/dL (ref 6.5–8.1)

## 2019-04-01 LAB — CBC WITH DIFFERENTIAL/PLATELET
Abs Immature Granulocytes: 0.02 10*3/uL (ref 0.00–0.07)
Basophils Absolute: 0.1 10*3/uL (ref 0.0–0.1)
Basophils Relative: 1 %
Eosinophils Absolute: 0.4 10*3/uL (ref 0.0–0.5)
Eosinophils Relative: 4 %
HCT: 41.9 % (ref 39.0–52.0)
Hemoglobin: 14 g/dL (ref 13.0–17.0)
Immature Granulocytes: 0 %
Lymphocytes Relative: 40 %
Lymphs Abs: 4 10*3/uL (ref 0.7–4.0)
MCH: 30.4 pg (ref 26.0–34.0)
MCHC: 33.4 g/dL (ref 30.0–36.0)
MCV: 91.1 fL (ref 80.0–100.0)
Monocytes Absolute: 1.4 10*3/uL — ABNORMAL HIGH (ref 0.1–1.0)
Monocytes Relative: 14 %
Neutro Abs: 4 10*3/uL (ref 1.7–7.7)
Neutrophils Relative %: 41 %
Platelets: 146 10*3/uL — ABNORMAL LOW (ref 150–400)
RBC: 4.6 MIL/uL (ref 4.22–5.81)
RDW: 13.7 % (ref 11.5–15.5)
WBC: 9.9 10*3/uL (ref 4.0–10.5)
nRBC: 0 % (ref 0.0–0.2)

## 2019-04-01 LAB — URIC ACID: Uric Acid, Serum: 5.2 mg/dL (ref 3.7–8.6)

## 2019-04-01 LAB — LACTATE DEHYDROGENASE: LDH: 180 U/L (ref 98–192)

## 2019-04-03 LAB — PROTEIN ELECTROPHORESIS, SERUM
A/G Ratio: 1 (ref 0.7–1.7)
Albumin ELP: 3.7 g/dL (ref 2.9–4.4)
Alpha-1-Globulin: 0.2 g/dL (ref 0.0–0.4)
Alpha-2-Globulin: 0.8 g/dL (ref 0.4–1.0)
Beta Globulin: 1.1 g/dL (ref 0.7–1.3)
Gamma Globulin: 1.4 g/dL (ref 0.4–1.8)
Globulin, Total: 3.6 g/dL (ref 2.2–3.9)
M-Spike, %: 0.5 g/dL — ABNORMAL HIGH
Total Protein ELP: 7.3 g/dL (ref 6.0–8.5)

## 2019-07-30 IMAGING — US US ABDOMEN COMPLETE
1 series · 14 of 25 positions shown · non-contrast
Comparison: Abdominal and pelvic CT scan December 08, 2008 and report
of an abdominal ultrasound May 06, 2003.

CLINICAL DATA: Thrombus cytopenia, splenomegaly. Previous
cholecystectomy. No current symptoms.

EXAM:
ABDOMEN ULTRASOUND COMPLETE

[Series 1: us abdomen complete · 0.28mm/px · 14 of 74 slices shown]
[im 1/74]
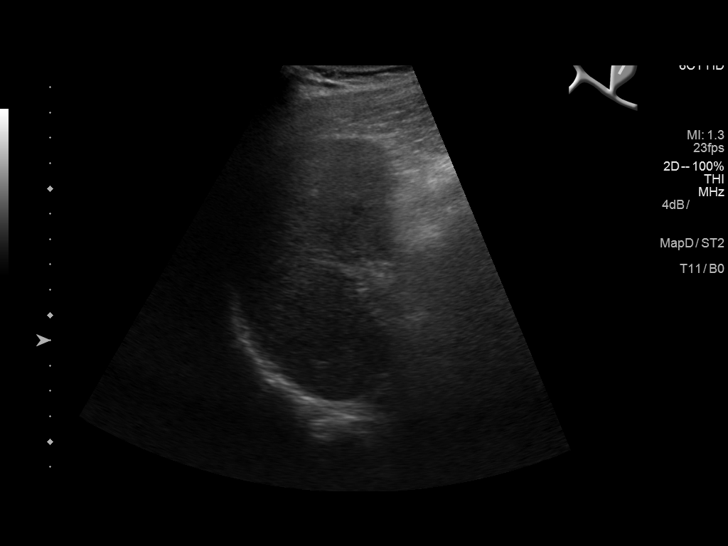
[im 7/74]
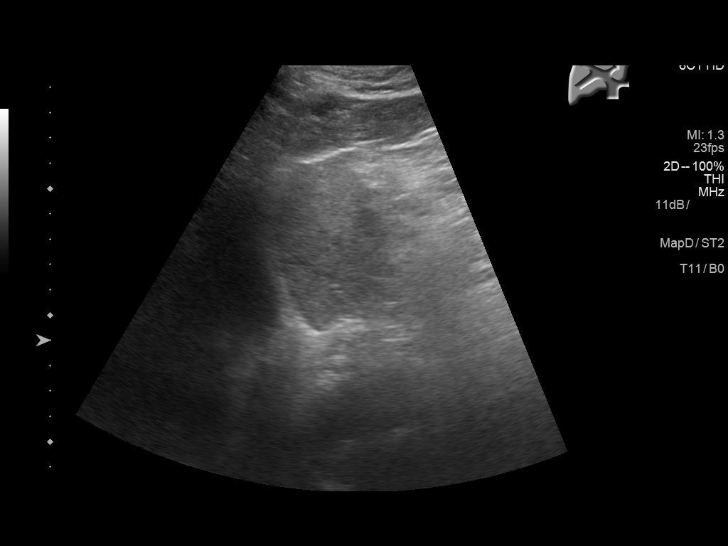
[im 13/74]
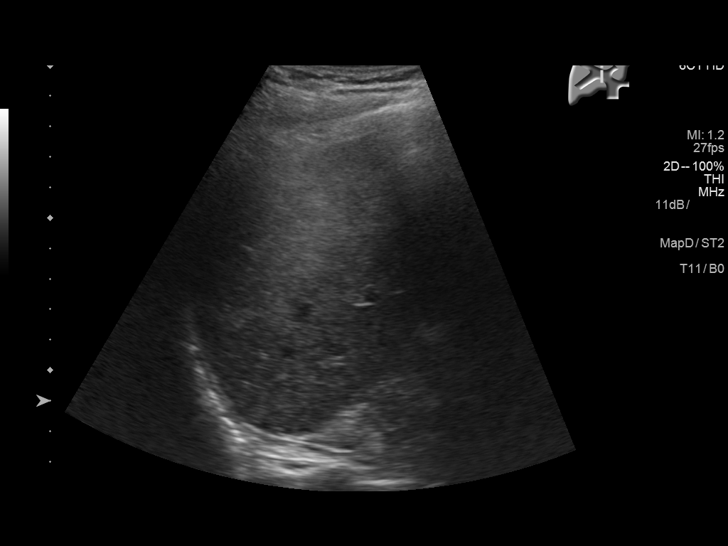
[im 19/74]
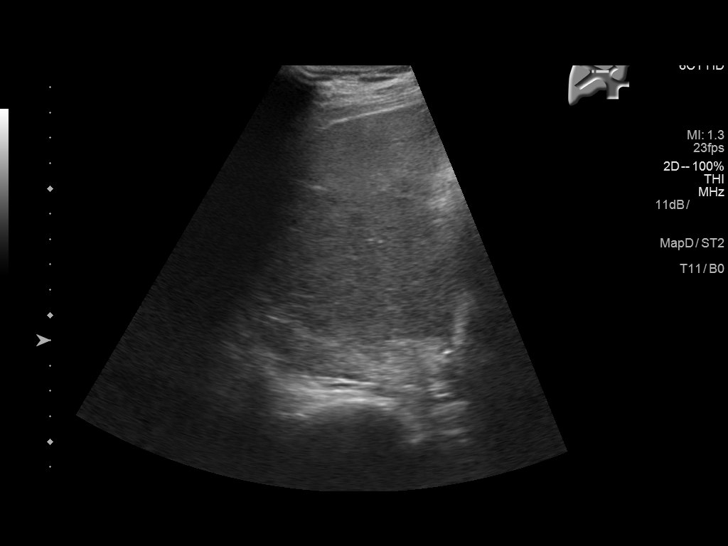
[im 25/74]
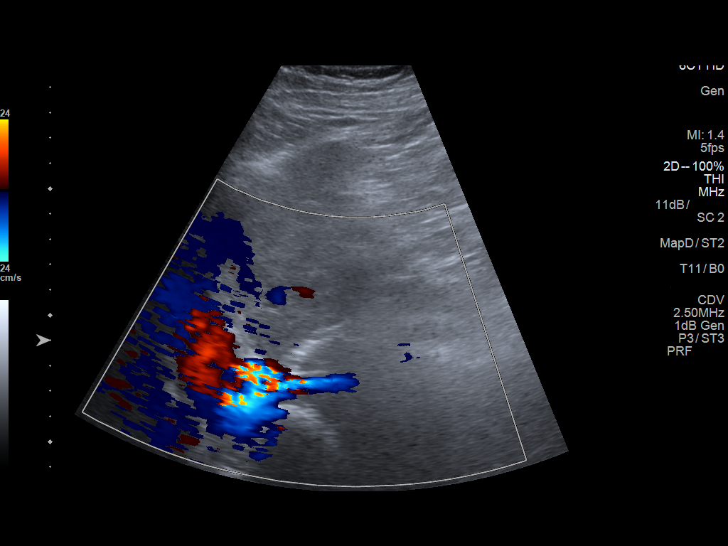
[im 28/74]
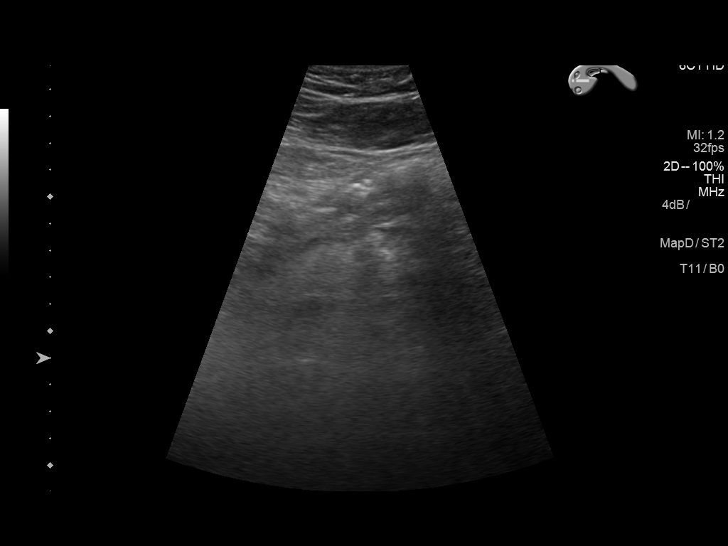
[im 34/74]
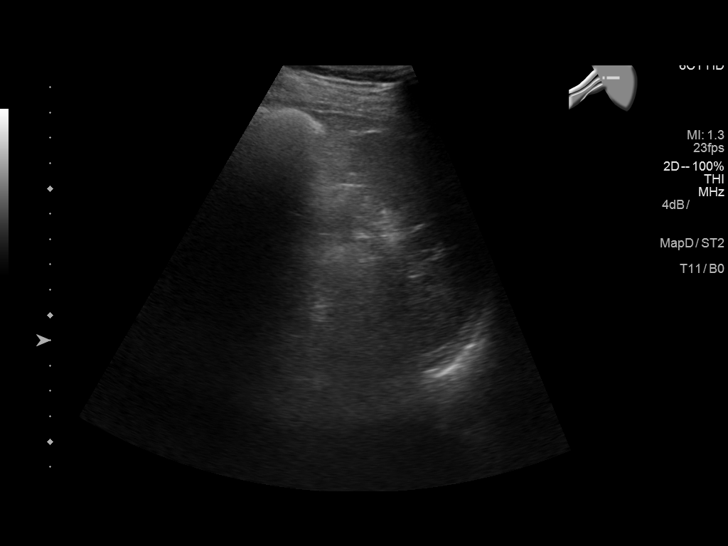
[im 40/74]
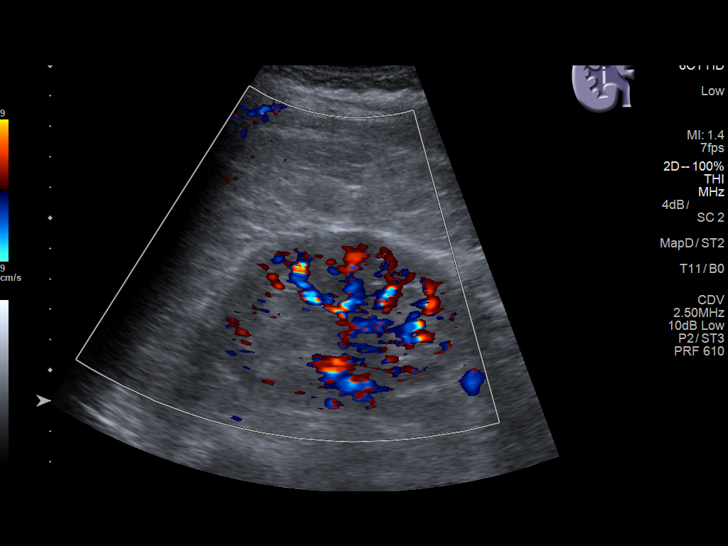
[im 46/74]
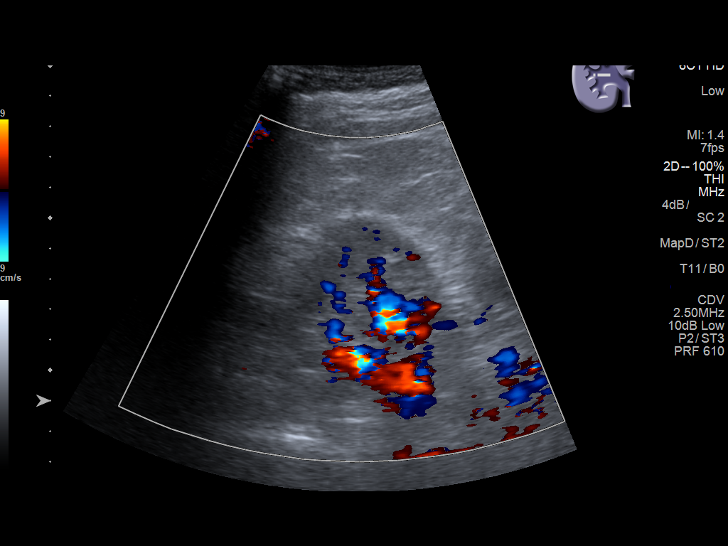
[im 49/74]
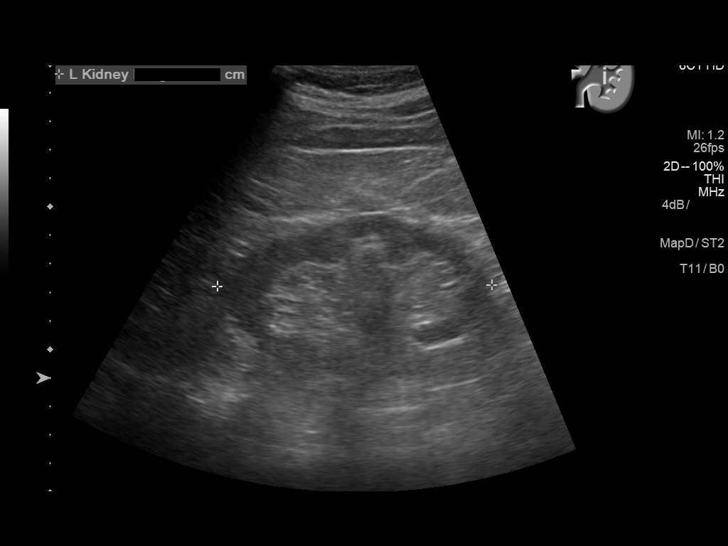
[im 55/74]
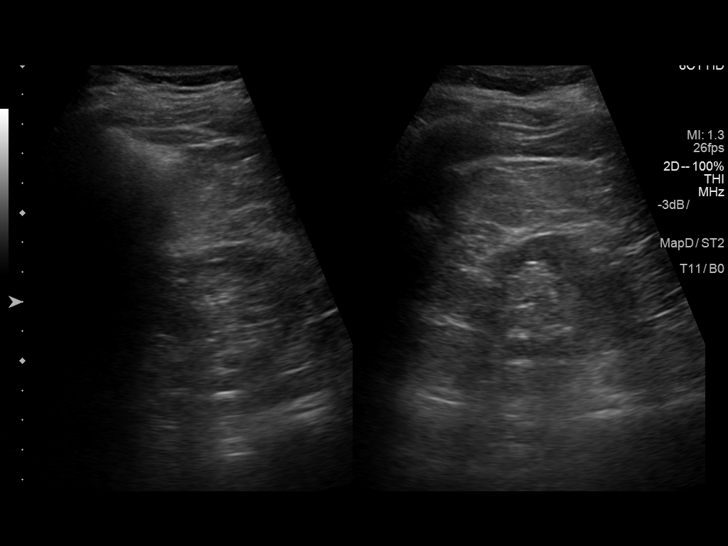
[im 61/74]
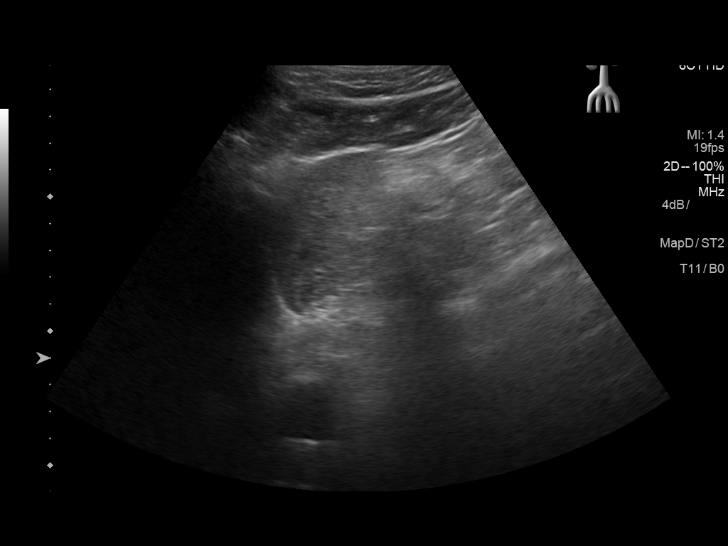
[im 67/74]
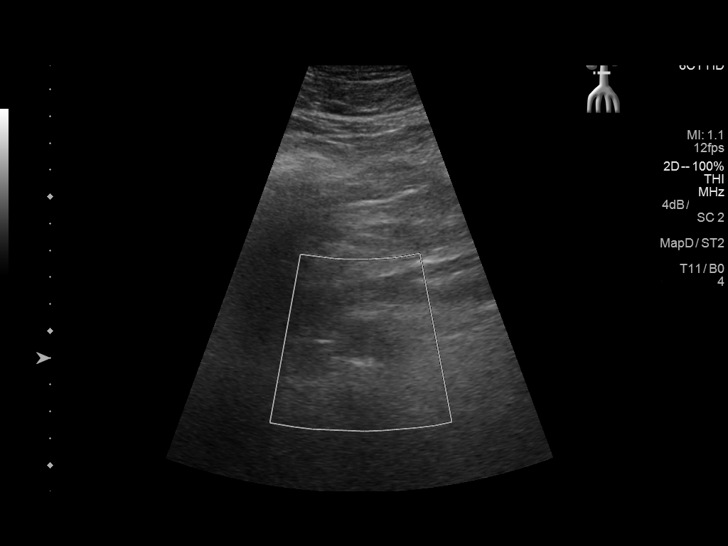
[im 74/74]
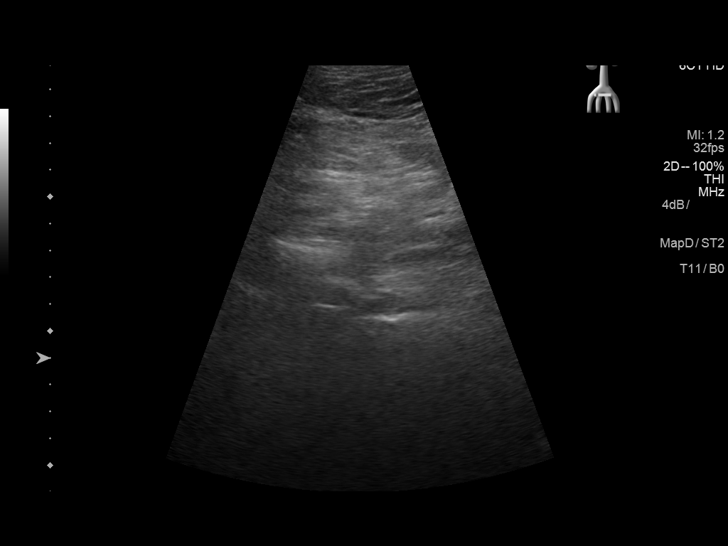

[14 of 25 positions shown; findings below may reference images not displayed]

FINDINGS: Gallbladder: The gallbladder is surgically absent.

Common bile duct: Diameter: 7.9 mm

Liver: No focal lesion identified. Within normal limits in
parenchymal echogenicity. Portal vein is patent on color Doppler
imaging with normal direction of blood flow towards the liver.

IVC: No abnormality visualized.

Pancreas: Visualized portion unremarkable.

Spleen: The spleen is normal in echotexture and contour measuring
4.9 cm in length.

Right Kidney: Length: 9.9 cm. Echogenicity within normal limits. No
mass or hydronephrosis visualized.

Left Kidney: Length: 9.6 cm. There is a midpole cyst measuring
cm in greatest dimension. The renal cortical echotexture remains
normal. There is no hydronephrosis.

Abdominal aorta: No aneurysm visualized.

Other findings: None.
IMPRESSION: No acute intra-abdominal abnormality is observed. The liver and
spleen are normal in echotexture. There is no splenomegaly.

## 2019-09-26 NOTE — Progress Notes (Signed)
[facility]  [address] [place], [place] [address] Phone: [phone]  Fax: [phone]   Clinic Day:  09/30/2019  Referring physician: Hortencia Pilar, MD  Chief Complaint: Zachary Wells is a 86 y.o. male with an IgG monoclonal gammopathy, thrombocytopenia and persistent monocytosis who is seen for a 6 month assessment.   HPI: The patient was last seen in the hematology clinic on 04/01/2019. At that time, he felt "pretty good". He continued to have intermittent hypersomnia. Exam was stable.  Hematocrit 41.9, hmoglobin 14.0, platelets 146,000, WBC 9,900, monocyte count 1,400. Creatinine 1.6. Uric acid was 5.2. LDH 180.  M spike was 0.5 gm/dL. Surveillance continued.   During the interim, he has felt "pretty good". He reports sleeping a lot because he is sitting down all the time. His wife reports that he sleeps majority of the day. He takes a nap after breakfast and lunch. She reports he is always tired. I encouraged him to be more active. He denies any B symptoms. He has bilateral shoulder pain.   He gets short of breath at times when laying flat on his back. He props his head on on pillows.  He will be seen in cardiology by [facility] on 10/02/2019. He has aortic stenosis and hypertension. His wife notes he is not on any BP medication. He will see Dr. Holley Raring on 10/03/2019.    Past Medical History:  Diagnosis Date  . Diabetes mellitus without complication ([place])   . Prostate cancer ([place])   . Thyroid disease     Past Surgical History:  Procedure Laterality Date  . BACK SURGERY  2008  . BACK SURGERY  1987  . BACK SURGERY  1984  . CHOLECYSTECTOMY    . HERNIA REPAIR  2004 and 2002  . NOSE SURGERY  1982  . PROSTATE SURGERY  1997    Family History  Problem Relation Age of Onset  . Cancer Brother     Social History:  reports that he has quit smoking. He has a 20.00 pack-year smoking history. He has never used smokeless tobacco. He  reports previous alcohol use. He reports that he does not use drugs. Former smoker 40 years ago. No alcohol intake. Past exposures of PCB (transformer oil). He is married to wife, Zachary Wells. He enjoys reading mystery books. The patient is accompanied by [facility] today.  Allergies:  Allergies  Allergen Reactions  . Sulfa Antibiotics Rash  . Simvastatin Other (See Comments)    Current Medications: Current Outpatient Medications  Medication Sig Dispense Refill  . aspirin (ASPIR-LOW) 81 MG EC tablet Take 81 mg by mouth daily.    . Blood Glucose Monitoring Suppl (FIFTY50 GLUCOSE METER 2.0) w/Device KIT Please check sugars once daily E11.9 Freestyle    . cetirizine (ZYRTEC) 10 MG tablet Take 10 mg by mouth daily.    . Coconut Oil 1000 MG CAPS Take by mouth daily.     Marland Kitchen desoximetasone (TOPICORT) 0.25 % cream desoximetasone 0.25 % topical cream    . fluocinonide cream (LIDEX) 0.05 % fluocinonide 0.05 % topical cream    . fluorouracil (EFUDEX) 5 % cream fluorouracil 5 % topical cream    . fluticasone (FLONASE) 50 MCG/ACT nasal spray Place 1 spray into both nostrils daily.    . Glucosamine Sulfate 1000 MG CAPS Take 2,000 mg by mouth daily.    Marland Kitchen glucose blood test strip FreeStyle Lite Strips    . Lancets 28G MISC Lancets,Ultra Thin 26 gauge    .  levothyroxine (SYNTHROID) 75 MCG tablet Take by mouth.    . metFORMIN (GLUCOPHAGE) 500 MG tablet Take 500 mg by mouth daily.    . Multiple Vitamins-Minerals (CENTRUM SILVER PO) Take 1 tablet by mouth daily.    . Omega-3 Fatty Acids (OMEGA-3 FISH OIL PO) Take 400 mg by mouth daily.    . rosuvastatin (CRESTOR) 40 MG tablet Take 40 mg by mouth daily.    . sildenafil (VIAGRA) 50 MG tablet Take 50 mg by mouth as needed.     No current facility-administered medications for this visit.    Review of Systems  Constitutional: Positive for malaise/fatigue and weight loss (2 lbs). Negative for chills, diaphoresis and fever.       Feels "pretty good".  Not active.    HENT: Positive for hearing loss (hearing aids). Negative for congestion, ear pain, nosebleeds, sinus pain and sore throat.   Eyes: Negative.  Negative for blurred vision, double vision and photophobia.  Respiratory: Positive for shortness of breath (when laying down). Negative for cough, hemoptysis and sputum production.   Cardiovascular: Positive for orthopnea. Negative for chest pain, palpitations, leg swelling and PND.       Aortic stenosis.  Gastrointestinal: Negative.  Negative for abdominal pain, blood in stool, constipation, diarrhea, melena, nausea and vomiting.       No dysphagia.  Genitourinary: Negative.  Negative for dysuria, frequency, hematuria and urgency.  Musculoskeletal: Positive for joint pain (chronic secondary to osteoarthritis; bilateral shoulders). Negative for back pain, falls, myalgias and neck pain.  Skin: Negative.  Negative for itching and rash.  Neurological: Negative for tremors, speech change, focal weakness, seizures, weakness and headaches.  Endo/Heme/Allergies: Does not bruise/bleed easily.       Diabetes and HYPOthyroidism.  Psychiatric/Behavioral: Negative for depression and memory loss. The patient is not nervous/anxious.        HYPERsomnia- some days better than others.  All other systems reviewed and are negative.  Performance status (ECOG): 2  Vitals Blood pressure (!) 127/58, pulse 64, temperature (!) 97.3 F (36.3 C), temperature source Tympanic, resp. rate 18, height 5' 6"  (1.676 m), weight 157 lb 4.8 oz (71.3 kg), SpO2 96 %.   Physical Exam  Constitutional: He is oriented to person, place, and time. He appears well-developed and well-nourished. No distress.  Elderly gentleman; he needs assistance off the table.  HENT:  Head: Normocephalic and atraumatic.  Mouth/Throat: Oropharynx is clear and moist. No oropharyngeal exudate.  Short gray hair.  Hearing aid.  Mask. Geographic tongue.  Eyes: Pupils are equal, round, and reactive to light.  Conjunctivae and EOM are normal. No scleral icterus.  Neck: No JVD present.  Cardiovascular: Normal rate and regular rhythm.  Murmur heard. Pulmonary/Chest: Effort normal and breath sounds normal. No respiratory distress. He has no wheezes. He has no rales.  Abdominal: Soft. Bowel sounds are normal. He exhibits no distension and no mass. There is no hepatosplenomegaly. There is no abdominal tenderness. There is no rebound and no guarding.  Musculoskeletal:        General: No edema. Normal range of motion.     Cervical back: Normal range of motion and neck supple.  Lymphadenopathy:       Head (right side): No preauricular, no posterior auricular and no occipital adenopathy present.       Head (left side): No preauricular, no posterior auricular and no occipital adenopathy present.    He has no cervical adenopathy.    He has no axillary adenopathy.  Right: No inguinal and no supraclavicular adenopathy present.       Left: No inguinal and no supraclavicular adenopathy present.  Neurological: He is alert and oriented to person, place, and time.  Skin: Skin is warm. He is not diaphoretic. No erythema.  Right lower extremity dressing s/p fall.  Psychiatric: He has a normal mood and affect. His behavior is normal. Judgment and thought content normal.  Nursing note and vitals reviewed.   Appointment on 09/30/2019  Component Date Value Ref Range Status  . LDH 09/30/2019 182  98 - 192 U/L Final   Performed at [facility], [address] [place], [place] [address]  . WBC 09/30/2019 9.1  4.0 - 10.5 K/uL Final  . RBC 09/30/2019 4.70  4.22 - 5.81 MIL/uL Final  . Hemoglobin 09/30/2019 14.2  13.0 - 17.0 g/dL Final  . HCT 09/30/2019 42.5  39.0 - 52.0 % Final  . MCV 09/30/2019 90.4  80.0 - 100.0 fL Final  . MCH 09/30/2019 30.2  26.0 - 34.0 pg Final  . MCHC 09/30/2019 33.4  30.0 - 36.0 g/dL Final  . RDW 09/30/2019 14.7  11.5 - 15.5 % Final  . Platelets 09/30/2019 144* 150 - 400  K/uL Final  . nRBC 09/30/2019 0.0  0.0 - 0.2 % Final  . Neutrophils Relative % 09/30/2019 46  % Final  . Neutro Abs 09/30/2019 4.2  1.7 - 7.7 K/uL Final  . Lymphocytes Relative 09/30/2019 33  % Final  . Lymphs Abs 09/30/2019 3.0  0.7 - 4.0 K/uL Final  . Monocytes Relative 09/30/2019 12  % Final  . Monocytes Absolute 09/30/2019 1.1* 0.1 - 1.0 K/uL Final  . Eosinophils Relative 09/30/2019 8  % Final  . Eosinophils Absolute 09/30/2019 0.7* 0.0 - 0.5 K/uL Final  . Basophils Relative 09/30/2019 1  % Final  . Basophils Absolute 09/30/2019 0.1  0.0 - 0.1 K/uL Final  . Immature Granulocytes 09/30/2019 0  % Final  . Abs Immature Granulocytes 09/30/2019 0.03  0.00 - 0.07 K/uL Final   Performed at [facility], [address] [place], [place] [address]  . Sodium 09/30/2019 138  135 - 145 mmol/L Final  . Potassium 09/30/2019 4.5  3.5 - 5.1 mmol/L Final  . Chloride 09/30/2019 102  98 - 111 mmol/L Final  . CO2 09/30/2019 27  22 - 32 mmol/L Final  . Glucose, Bld 09/30/2019 137* 70 - 99 mg/dL Final   Glucose reference range applies only to samples taken after fasting for at least 8 hours.  . BUN 09/30/2019 37* 8 - 23 mg/dL Final  . Creatinine, Ser 09/30/2019 1.54* 0.61 - 1.24 mg/dL Final  . Calcium 09/30/2019 9.6  8.9 - 10.3 mg/dL Final  . Total Protein 09/30/2019 7.7  6.5 - 8.1 g/dL Final  . Albumin 09/30/2019 4.1  3.5 - 5.0 g/dL Final  . AST 09/30/2019 39  15 - 41 U/L Final  . ALT 09/30/2019 32  0 - 44 U/L Final  . Alkaline Phosphatase 09/30/2019 83  38 - 126 U/L Final  . Total Bilirubin 09/30/2019 1.4* 0.3 - 1.2 mg/dL Final  . GFR calc non Af Amer 09/30/2019 40* >60 mL/min Final  . GFR calc Af Amer 09/30/2019 46* >60 mL/min Final  . Anion gap 09/30/2019 9  5 - 15 Final   Performed at [facility], [address], [place], [place] [address]    Assessment:  Zachary Wells is a 86 y.o. male with  mild thrombocytopenia. He has had intermittent thrombocytopenia dating  back to 09/2014. Platelet count has ranged between 102,000 - 245,000. He has had persistent mild thrombocytopenia since 03/2018. Platelet count was 146,000 on 03/13/2018 and 129,000 on 04/10/2018. He has had persistent monocytosissince 2016.  Work-up on 09/18/2019revealed the following normal studies: PT, PTT, hepatitis B core antibody, hepatitis C antibody, HIV antibody, ANA with reflex, B12, folate, copper level, and BCR-ABL. Platelet count in a blue top tube (citrated) was 140,000. Peripheral smearwas unremarkable.   Abdominal ultrasound on 04/24/2018 revealed no splenomegaly. Liver and spleen were unremarkable.  Work-up on 03/24/2020revealed a hematocrit of 42.9, hemoglobin 14.5, platelets 138,000, white count 7300 with an ANC of 3800. SPEPrevealed a 0.8 gm/dLIgG monoclonal protein with kappa light chain specificity. Kappa light chain 99.5, lambda free light chains 25.4,and ratio 3.92 (0.26 -1.65).IgG was 1599.IgA and IgM were normal. 24-hour UPEPrevealed 151 mg of protein/24 hours of which 40 mg/24 hourswereBence-Jones protein positive, kappa type. Creatinine was 1.58. Calcium 9.5, protein 8.2, albumin 4.2. Liver functiontests were normal.  He has a monoclonal gammopathy of unknown significance (MGUS).  Immunofixation reveals an IgG monoclonal protein with kappa light chain specificity.  SPEP has been followed (gm/dL): 0.8 on 10/23/2018, 0.5 on 02/04/2019, 0.5 on 04/01/2019, and 0.6 on 09/30/2019.  Bone survey on 02/25/2019 revealed no evidence of lytic or sclerotic bone lesion. There were no acute findings. There was cervical and lumbar spondylosis, remote healed left pubic rami fractures, and atherosclerosis.  He denies any new medications or herbal products. He has received blood transfusions in the past. He has no history of autoimmune disease or hepatitis.  Symptomatically, he feels "pretty good".  He denies any B symptoms.  Exam is stable.   Bilirubin is 1.4 (0.1 direct).  Plan: 1.   Labs today: CBC with differential, CMP, SPEP, FLCA, LDH. 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 He has no CRAB criteria: elevated calcium, renal dysfunction, anemia or bone lesions. Hemoglobin and calcium are normal. Creatinine fluctuates but remains relatively stable.             Bone survey on 02/25/2019 revealed no lytic lesions. Continue to monitor. 3.Renal insufficiency Creatinine 1.54 today. Creatinine has ranged between 1.45 - 1.60 since 09/2018.       Creatinine ranged between 0.86 - 1.4 from 12/09/2018 - 12/15/2008.       He is followed by Dr. Holley Raring in nephrology. 4.Thrombocytopenia Platelet count is144,000. Continue to monitor. 5. Monocytosis  Monocyte count is 1100.    Monocyte count has ranged between 800 - 1800 without trend since 02/04/2013.  Continue to monitor. 6.   Elevated bilirubin  Bilirubin 1.4.  Check direct bilirubin. 7.   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 TSH was normal on 11/07/2018.             With increased activity, he then has periods of fatigue.             Continue to monitor. 8. RTC in 6 months for MD assessment and labs (CBC with differential, CMP, SPEP, LDH).   I discussed the assessment and treatment plan with the patient.  The patient was provided an opportunity to ask questions and all were answered.  The patient agreed with the plan and demonstrated an understanding of the instructions.  The patient was advised to call back if the symptoms worsen or if the condition fails to improve as anticipated.  I provided 14 minutes  of face-to-face time during this this encounter and > 50% was spent counseling as documented under my assessment and plan.    Lequita Asal, MD, PhD    09/30/2019, 2:11 PM  I, Selena Batten, am acting as scribe for [employer]. Mike Gip, MD, PhD.  I, Jaryah Aracena C. Mike Gip, MD, have reviewed the above documentation for accuracy and completeness, and I agree with the above.

## 2019-09-27 ENCOUNTER — Other Ambulatory Visit: Payer: Self-pay

## 2019-09-27 ENCOUNTER — Encounter: Payer: Self-pay | Admitting: Hematology and Oncology

## 2019-09-27 NOTE — Progress Notes (Signed)
No new changes noted today. The patient Name and DOB has been verified today.

## 2019-09-30 ENCOUNTER — Inpatient Hospital Stay: Payer: Medicare Other | Attending: Hematology and Oncology | Admitting: Hematology and Oncology

## 2019-09-30 ENCOUNTER — Inpatient Hospital Stay: Payer: Medicare Other

## 2019-09-30 ENCOUNTER — Encounter: Payer: Self-pay | Admitting: Hematology and Oncology

## 2019-09-30 ENCOUNTER — Other Ambulatory Visit: Payer: Self-pay

## 2019-09-30 VITALS — BP 127/58 | HR 64 | Temp 97.3°F | Resp 18 | Ht 66.0 in | Wt 157.3 lb

## 2019-09-30 DIAGNOSIS — R17 Unspecified jaundice: Secondary | ICD-10-CM

## 2019-09-30 DIAGNOSIS — M25512 Pain in left shoulder: Secondary | ICD-10-CM | POA: Insufficient documentation

## 2019-09-30 DIAGNOSIS — D72821 Monocytosis (symptomatic): Secondary | ICD-10-CM | POA: Insufficient documentation

## 2019-09-30 DIAGNOSIS — N289 Disorder of kidney and ureter, unspecified: Secondary | ICD-10-CM | POA: Diagnosis not present

## 2019-09-30 DIAGNOSIS — R5381 Other malaise: Secondary | ICD-10-CM | POA: Diagnosis not present

## 2019-09-30 DIAGNOSIS — Z79899 Other long term (current) drug therapy: Secondary | ICD-10-CM | POA: Insufficient documentation

## 2019-09-30 DIAGNOSIS — D472 Monoclonal gammopathy: Secondary | ICD-10-CM | POA: Diagnosis present

## 2019-09-30 DIAGNOSIS — M25511 Pain in right shoulder: Secondary | ICD-10-CM | POA: Diagnosis not present

## 2019-09-30 DIAGNOSIS — D696 Thrombocytopenia, unspecified: Secondary | ICD-10-CM | POA: Diagnosis not present

## 2019-09-30 DIAGNOSIS — Z8546 Personal history of malignant neoplasm of prostate: Secondary | ICD-10-CM | POA: Insufficient documentation

## 2019-09-30 DIAGNOSIS — R5382 Chronic fatigue, unspecified: Secondary | ICD-10-CM | POA: Diagnosis not present

## 2019-09-30 DIAGNOSIS — Z8673 Personal history of transient ischemic attack (TIA), and cerebral infarction without residual deficits: Secondary | ICD-10-CM | POA: Insufficient documentation

## 2019-09-30 DIAGNOSIS — I10 Essential (primary) hypertension: Secondary | ICD-10-CM | POA: Diagnosis not present

## 2019-09-30 DIAGNOSIS — Z7984 Long term (current) use of oral hypoglycemic drugs: Secondary | ICD-10-CM | POA: Diagnosis not present

## 2019-09-30 DIAGNOSIS — I350 Nonrheumatic aortic (valve) stenosis: Secondary | ICD-10-CM | POA: Insufficient documentation

## 2019-09-30 DIAGNOSIS — Z7982 Long term (current) use of aspirin: Secondary | ICD-10-CM | POA: Diagnosis not present

## 2019-09-30 DIAGNOSIS — R0602 Shortness of breath: Secondary | ICD-10-CM | POA: Diagnosis not present

## 2019-09-30 DIAGNOSIS — Z87891 Personal history of nicotine dependence: Secondary | ICD-10-CM | POA: Diagnosis not present

## 2019-09-30 DIAGNOSIS — E119 Type 2 diabetes mellitus without complications: Secondary | ICD-10-CM | POA: Diagnosis not present

## 2019-09-30 LAB — BILIRUBIN, DIRECT: Bilirubin, Direct: 0.1 mg/dL (ref 0.0–0.2)

## 2019-09-30 LAB — CBC WITH DIFFERENTIAL/PLATELET
Abs Immature Granulocytes: 0.03 10*3/uL (ref 0.00–0.07)
Basophils Absolute: 0.1 10*3/uL (ref 0.0–0.1)
Basophils Relative: 1 %
Eosinophils Absolute: 0.7 10*3/uL — ABNORMAL HIGH (ref 0.0–0.5)
Eosinophils Relative: 8 %
HCT: 42.5 % (ref 39.0–52.0)
Hemoglobin: 14.2 g/dL (ref 13.0–17.0)
Immature Granulocytes: 0 %
Lymphocytes Relative: 33 %
Lymphs Abs: 3 10*3/uL (ref 0.7–4.0)
MCH: 30.2 pg (ref 26.0–34.0)
MCHC: 33.4 g/dL (ref 30.0–36.0)
MCV: 90.4 fL (ref 80.0–100.0)
Monocytes Absolute: 1.1 10*3/uL — ABNORMAL HIGH (ref 0.1–1.0)
Monocytes Relative: 12 %
Neutro Abs: 4.2 10*3/uL (ref 1.7–7.7)
Neutrophils Relative %: 46 %
Platelets: 144 10*3/uL — ABNORMAL LOW (ref 150–400)
RBC: 4.7 MIL/uL (ref 4.22–5.81)
RDW: 14.7 % (ref 11.5–15.5)
WBC: 9.1 10*3/uL (ref 4.0–10.5)
nRBC: 0 % (ref 0.0–0.2)

## 2019-09-30 LAB — COMPREHENSIVE METABOLIC PANEL
ALT: 32 U/L (ref 0–44)
AST: 39 U/L (ref 15–41)
Albumin: 4.1 g/dL (ref 3.5–5.0)
Alkaline Phosphatase: 83 U/L (ref 38–126)
Anion gap: 9 (ref 5–15)
BUN: 37 mg/dL — ABNORMAL HIGH (ref 8–23)
CO2: 27 mmol/L (ref 22–32)
Calcium: 9.6 mg/dL (ref 8.9–10.3)
Chloride: 102 mmol/L (ref 98–111)
Creatinine, Ser: 1.54 mg/dL — ABNORMAL HIGH (ref 0.61–1.24)
GFR calc Af Amer: 46 mL/min — ABNORMAL LOW (ref >60)
GFR calc non Af Amer: 40 mL/min — ABNORMAL LOW (ref >60)
Glucose, Bld: 137 mg/dL — ABNORMAL HIGH (ref 70–99)
Potassium: 4.5 mmol/L (ref 3.5–5.1)
Sodium: 138 mmol/L (ref 135–145)
Total Bilirubin: 1.4 mg/dL — ABNORMAL HIGH (ref 0.3–1.2)
Total Protein: 7.7 g/dL (ref 6.5–8.1)

## 2019-09-30 LAB — LACTATE DEHYDROGENASE: LDH: 182 U/L (ref 98–192)

## 2019-10-01 LAB — KAPPA/LAMBDA LIGHT CHAINS
Kappa free light chain: 105.2 mg/L — ABNORMAL HIGH (ref 3.3–19.4)
Kappa, lambda light chain ratio: 3.98 — ABNORMAL HIGH (ref 0.26–1.65)
Lambda free light chains: 26.4 mg/L — ABNORMAL HIGH (ref 5.7–26.3)

## 2019-10-02 LAB — PROTEIN ELECTROPHORESIS, SERUM
A/G Ratio: 1.3 (ref 0.7–1.7)
Albumin ELP: 4 g/dL (ref 2.9–4.4)
Alpha-1-Globulin: 0.2 g/dL (ref 0.0–0.4)
Alpha-2-Globulin: 0.7 g/dL (ref 0.4–1.0)
Beta Globulin: 1 g/dL (ref 0.7–1.3)
Gamma Globulin: 1.3 g/dL (ref 0.4–1.8)
Globulin, Total: 3.2 g/dL (ref 2.2–3.9)
M-Spike, %: 0.6 g/dL — ABNORMAL HIGH
Total Protein ELP: 7.2 g/dL (ref 6.0–8.5)

## 2019-11-30 DEATH — deceased

## 2020-01-13 IMAGING — US US RENAL
1 series · 14 of 25 positions shown · non-contrast
Comparison: 04/24/2018 ultrasound.

CLINICAL DATA: 89-year-old male with stage 3 chronic kidney
disease. Initial encounter.

EXAM:
RENAL / URINARY TRACT ULTRASOUND COMPLETE

[Series 1: us renal · 0.23mm/px · 14 of 31 slices shown]
[im 1/31]
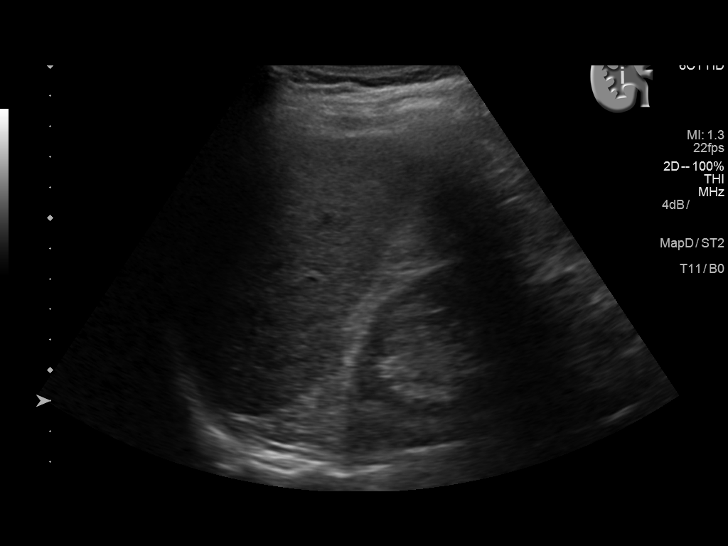
[im 3/31]
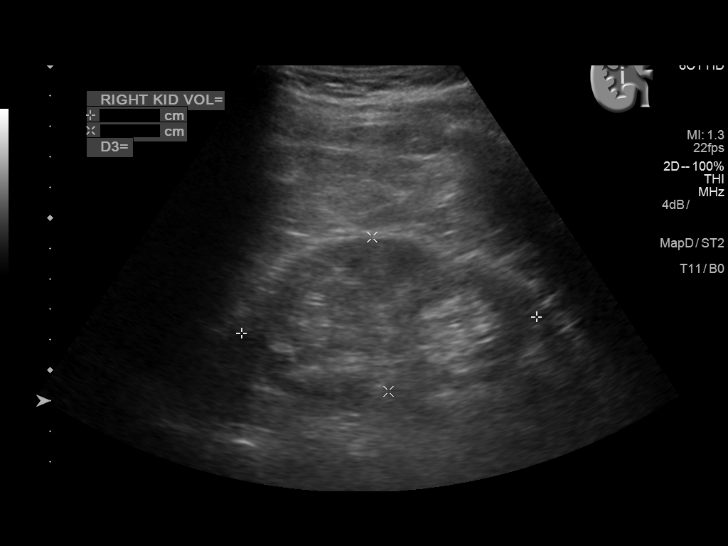
[im 6/31]
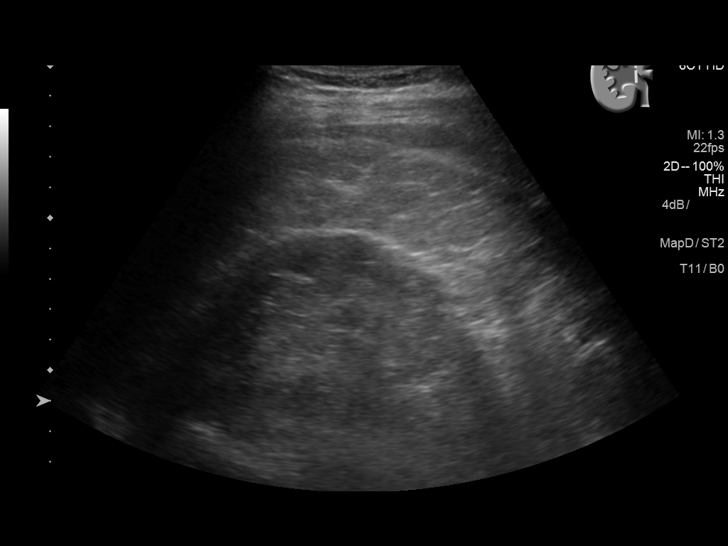
[im 8/31]
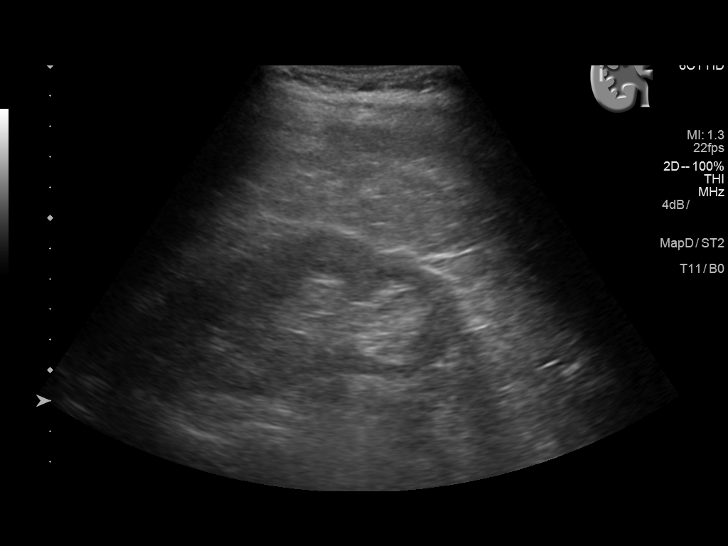
[im 11/31]
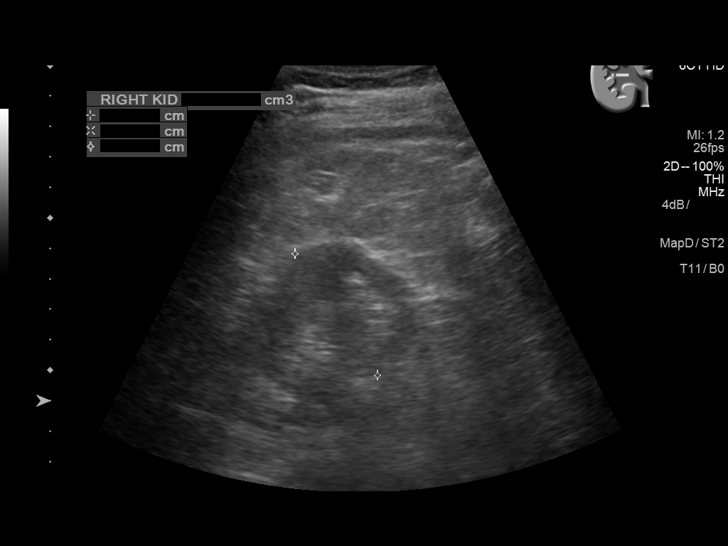
[im 12/31]
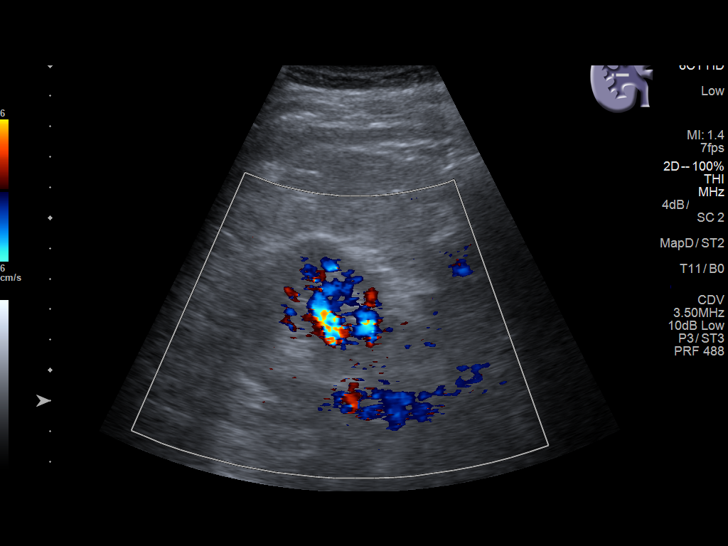
[im 14/31]
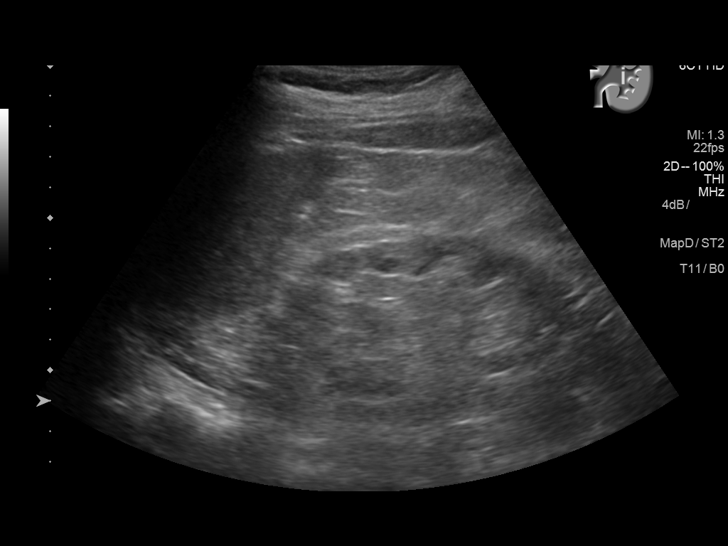
[im 17/31]
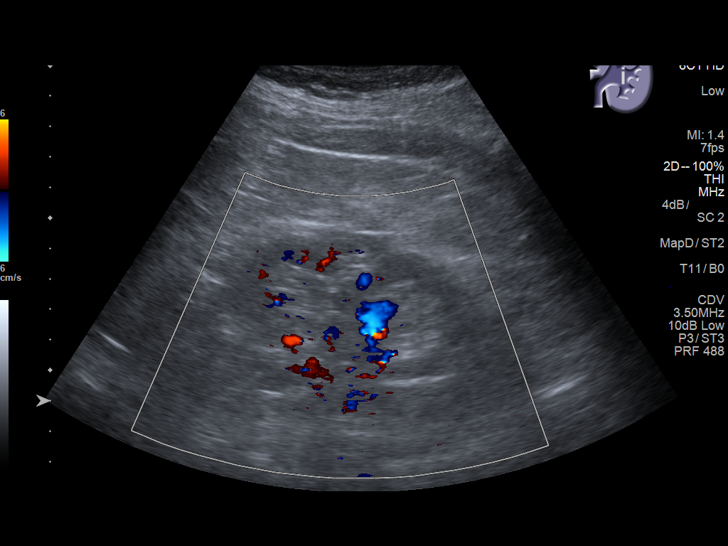
[im 19/31]
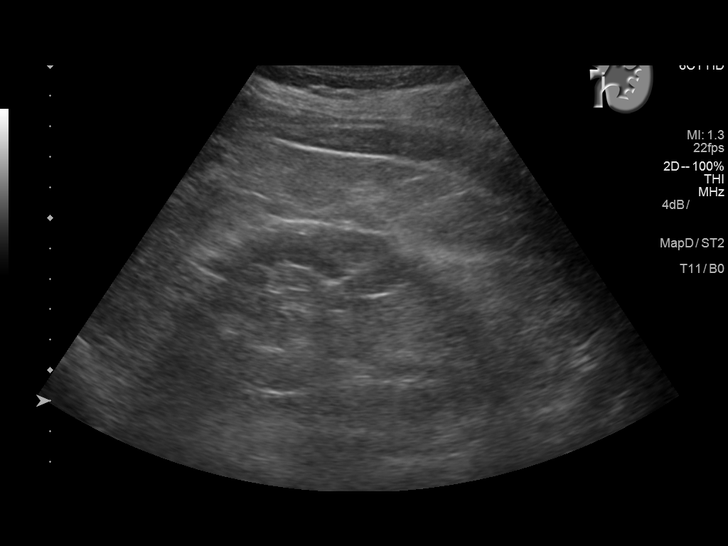
[im 21/31]
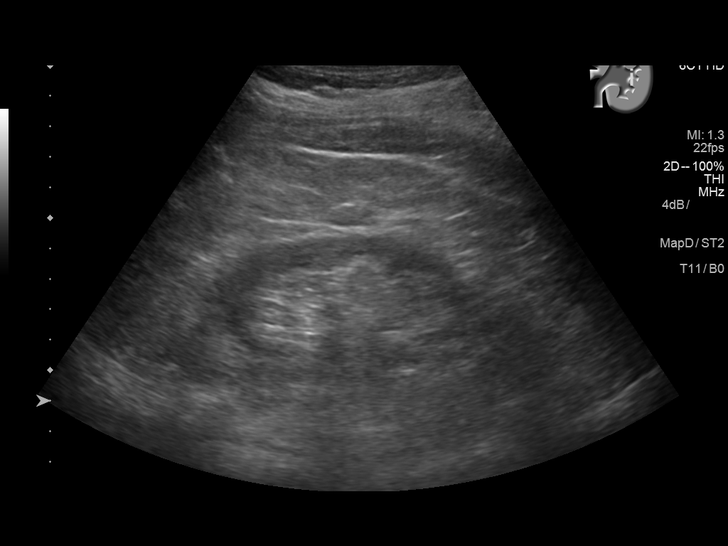
[im 23/31]
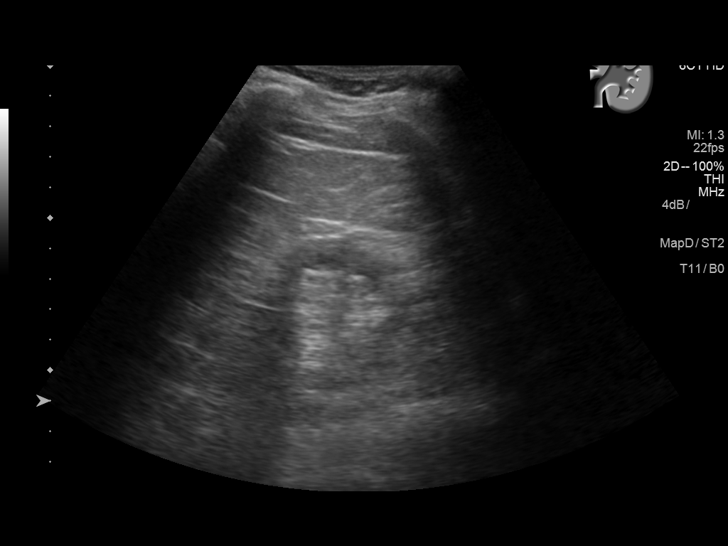
[im 26/31]
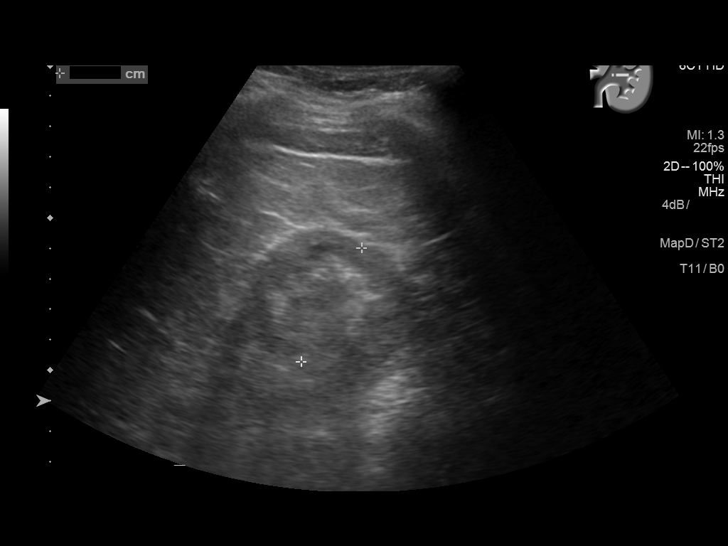
[im 28/31]
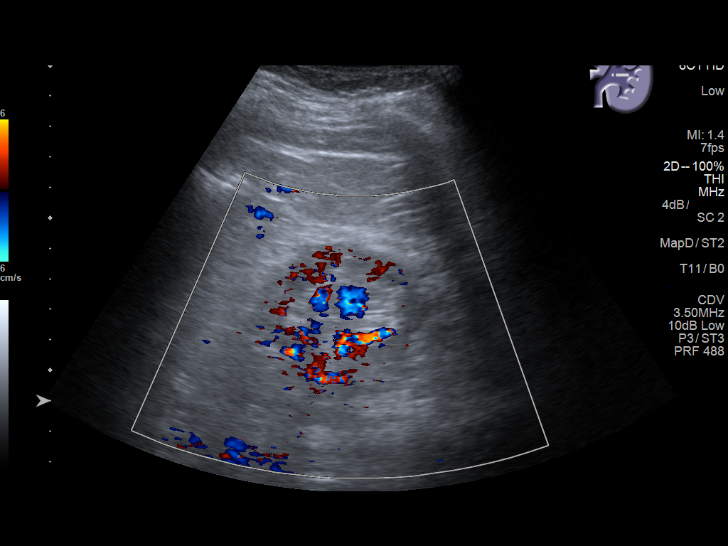
[im 31/31]
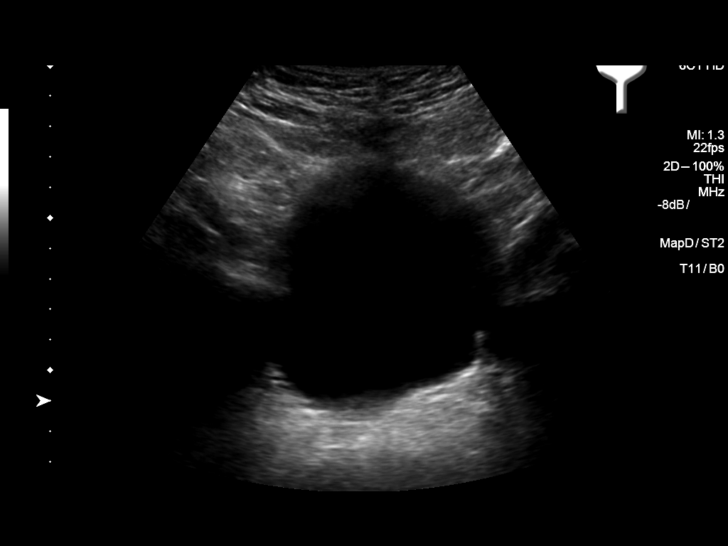

[14 of 25 positions shown; findings below may reference images not displayed]

FINDINGS: Right Kidney:

Renal measurements: 9.7 x 5.1 x 4.8 cm = volume: 125 mL. Increased
renal echogenicity. Mild renal parenchymal thinning. No
hydronephrosis or mass.

Left Kidney:

Renal measurements: 9.6 x 5 x 4.2 cm = volume: 105 mL. Increased
renal echogenicity. Mild renal parenchymal thinning. No
hydronephrosis or mass.

Bladder:

Appears normal for degree of bladder distention.
IMPRESSION: 1. Increased renal parenchyma echogenicity with mild renal
parenchymal thinning bilaterally may reflect changes of medical
renal disease.
2. No hydronephrosis.

## 2020-03-30 ENCOUNTER — Other Ambulatory Visit: Payer: Medicare Other

## 2020-03-30 ENCOUNTER — Ambulatory Visit: Payer: Medicare Other | Admitting: Hematology and Oncology
